# Patient Record
Sex: Female | Born: 1995 | Race: Black or African American | Hispanic: No | Marital: Single | Smoking: Current every day smoker
Health system: Southern US, Community
[De-identification: ages and names within clinical notes are randomized; demographics above are authoritative.]

## PROBLEM LIST (undated history)

## (undated) DIAGNOSIS — F329 Major depressive disorder, single episode, unspecified: Secondary | ICD-10-CM

## (undated) DIAGNOSIS — R062 Wheezing: Secondary | ICD-10-CM

## (undated) DIAGNOSIS — F419 Anxiety disorder, unspecified: Secondary | ICD-10-CM

## (undated) DIAGNOSIS — E785 Hyperlipidemia, unspecified: Secondary | ICD-10-CM

## (undated) DIAGNOSIS — D649 Anemia, unspecified: Secondary | ICD-10-CM

## (undated) DIAGNOSIS — R7303 Prediabetes: Secondary | ICD-10-CM

## (undated) DIAGNOSIS — J45909 Unspecified asthma, uncomplicated: Secondary | ICD-10-CM

## (undated) DIAGNOSIS — T7840XA Allergy, unspecified, initial encounter: Secondary | ICD-10-CM

## (undated) DIAGNOSIS — F32A Depression, unspecified: Secondary | ICD-10-CM

## (undated) HISTORY — PX: TONSILLECTOMY: SUR1361

## (undated) HISTORY — PX: TIBIA FRACTURE SURGERY: SHX806

---
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
## 2017-12-25 IMAGING — CR DG ABDOMEN 2V
1 series · 3 of 3 positions shown · non-contrast
Comparison: None.

CLINICAL DATA: One month history of abdominal pain.

EXAM:
ABDOMEN - 2 VIEW

[Series 1: dg abd 2 views · 0.14mm/px · 3 of 3 slices shown]
[im 1/3]
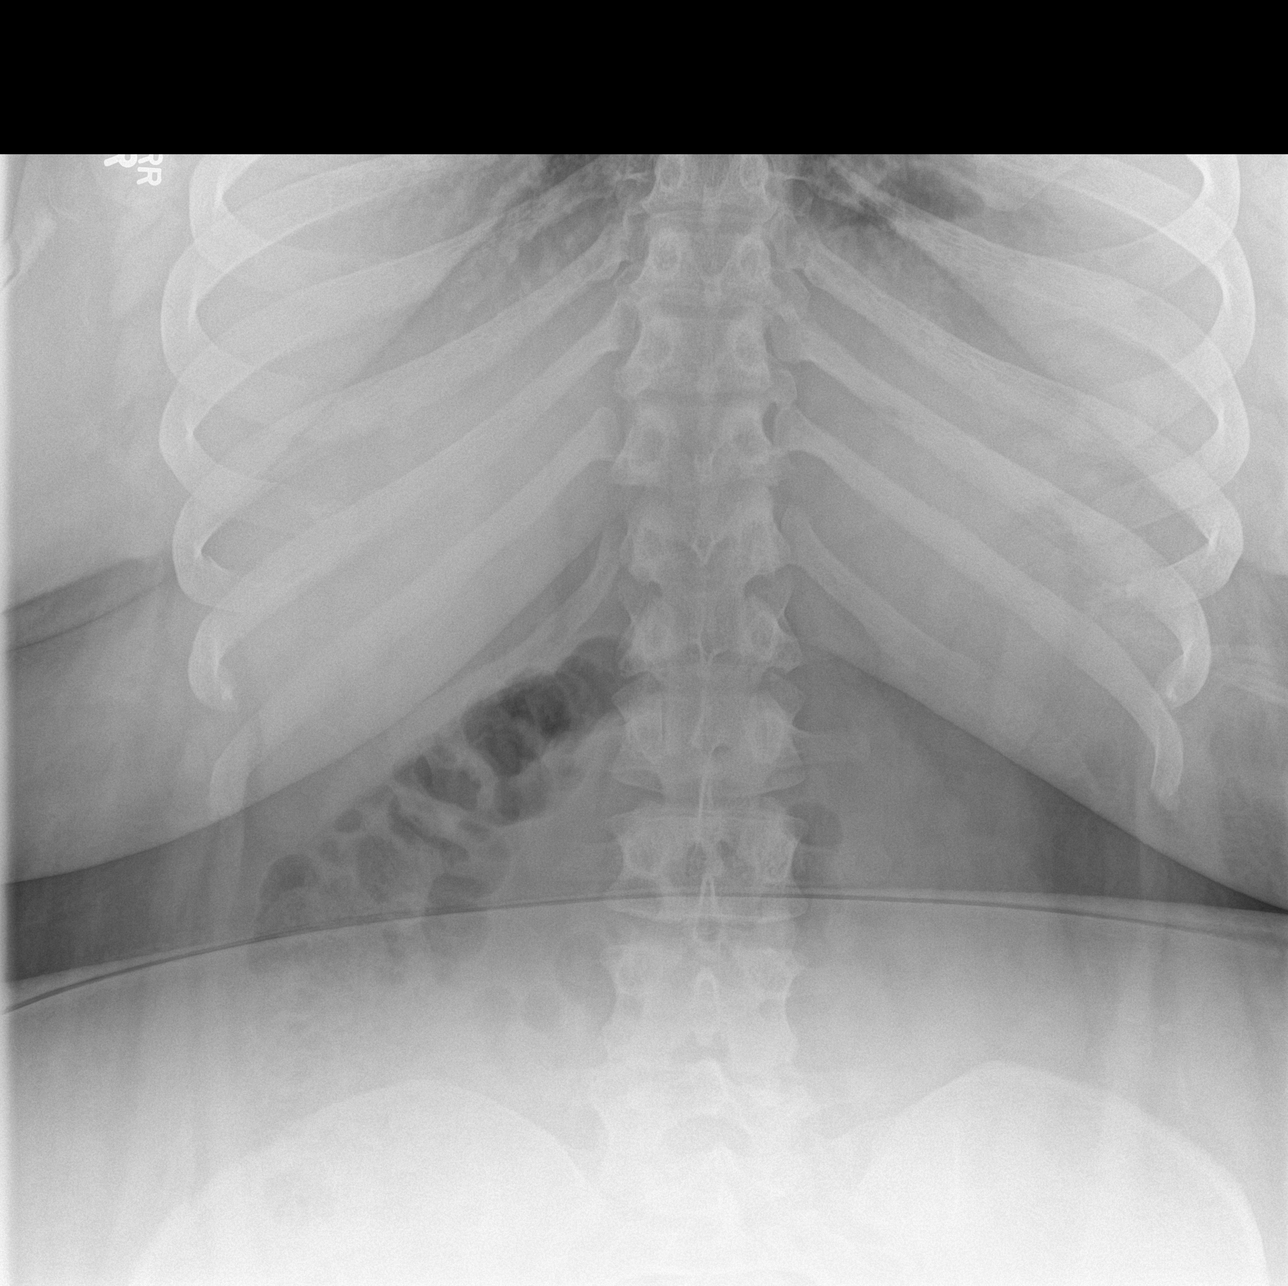
[im 2/3]
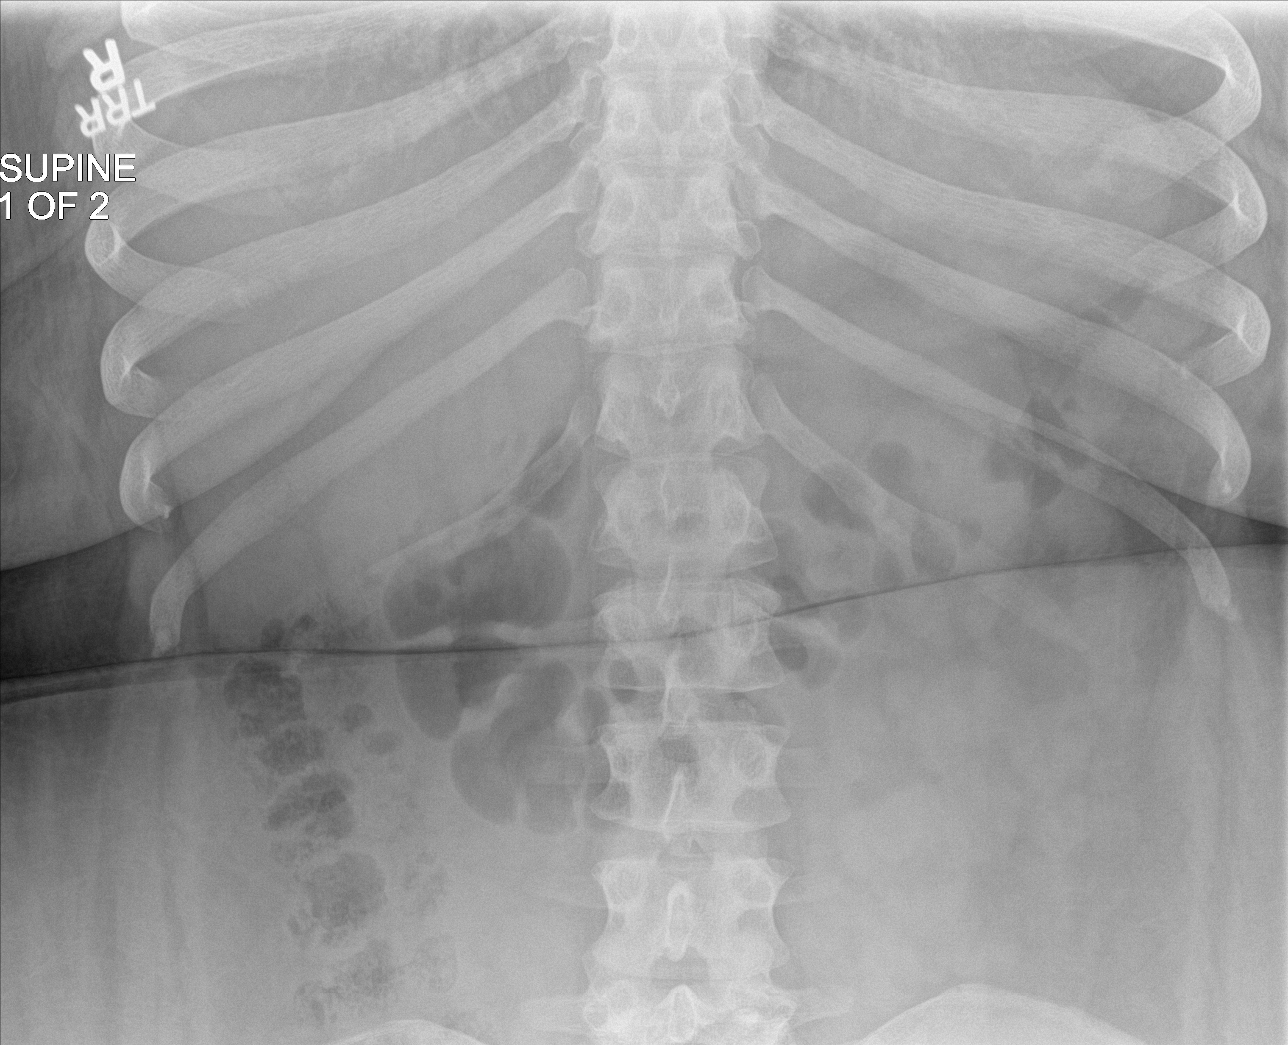
[im 3/3]
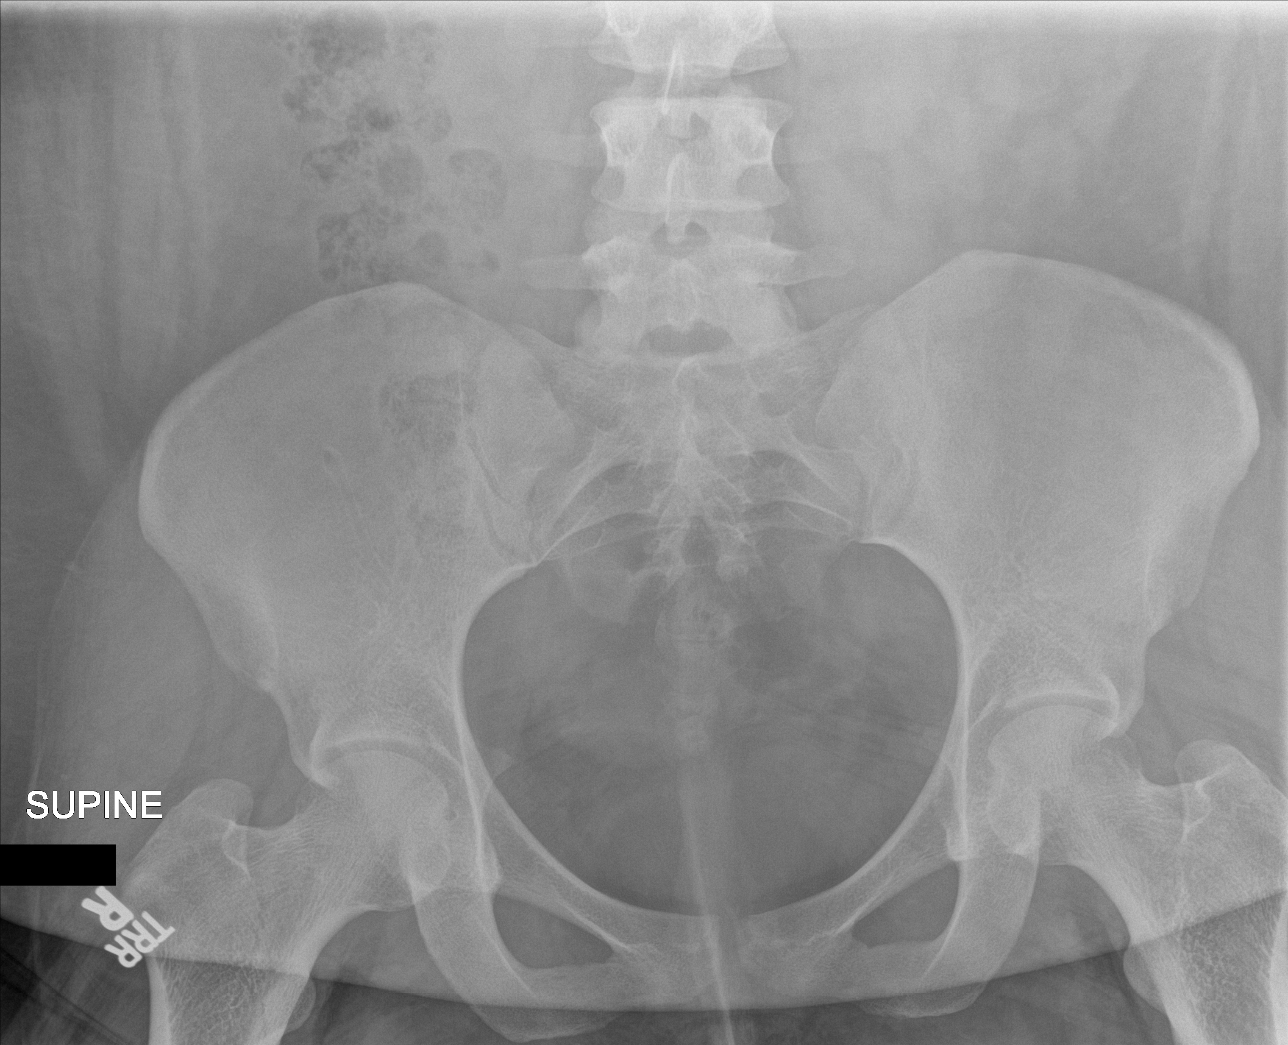

[3 of 3 positions shown; findings below may reference images not displayed]

FINDINGS: The abdominal bowel gas pattern is unremarkable. No findings for
obstruction an or perforation. The soft tissue shadows are
maintained. No worrisome calcifications. The bony structures are
unremarkable.
IMPRESSION: Unremarkable abdominal radiographs.

## 2018-04-19 ENCOUNTER — Emergency Department
Admission: EM | Admit: 2018-04-19 | Discharge: 2018-04-19 | Disposition: A | Discharge status: Home / Self Care | Payer: Self-pay | Attending: Emergency Medicine | Admitting: Emergency Medicine

## 2018-04-19 ENCOUNTER — Other Ambulatory Visit: Payer: Self-pay

## 2018-04-19 DIAGNOSIS — Z79899 Other long term (current) drug therapy: Secondary | ICD-10-CM | POA: Insufficient documentation

## 2018-04-19 DIAGNOSIS — J0100 Acute maxillary sinusitis, unspecified: Secondary | ICD-10-CM | POA: Insufficient documentation

## 2018-04-19 MED ORDER — LORATADINE 10 MG PO TABS: 10.0000 mg | ORAL_TABLET | Freq: Every day | ORAL | 0 refills | Status: DC

## 2018-04-19 MED ORDER — FLUTICASONE PROPIONATE 50 MCG/ACT NA SUSP: 2.0000 | Freq: Every day | NASAL | 0 refills | Status: DC

## 2018-04-19 MED ORDER — DOXYCYCLINE HYCLATE 50 MG PO CAPS: 100.0000 mg | ORAL_CAPSULE | Freq: Two times a day (BID) | ORAL | 0 refills | Status: AC

## 2018-04-19 NOTE — ED Notes (Signed)
Pt with face, ear pain x 3 days. Clear breath sounds. Decreased hearing. Generalized aches.

## 2018-04-19 NOTE — ED Provider Notes (Signed)
[facility] Emergency Department Provider Note  ____________________________________________  Time seen: Approximately 1:44 PM  I have reviewed the triage vital signs and the nursing notes.   HISTORY  Chief Complaint URI    HPI Kellie Schmidt is a 22 y.o. female that presents to emergency department for evaluation of left ear pain, nasal congestion for 4 days.  She is having difficulty hearing out of left ear.  Mother states that cheeks felt swollen this morning.  Patient has a sinus infection every year.  She has seasonal allergies.  She smokes a couple of "blacks " a day.  No fever, chills, shortness of breath, chest pain, nausea, vomiting, abdominal pain.   Past Medical History:  Diagnosis Date  . Anemia   . Borderline diabetic   . Depression   . Hyperlipemia   . Morbid obesity (HCC)   . Wheezing     There are no active problems to display for this patient.   Past Surgical History:  Procedure Laterality Date  . TIBIA FRACTURE SURGERY    . TONSILLECTOMY      Prior to Admission medications   Medication Sig Start Date End Date Taking? Authorizing Provider  Albuterol Sulfate 108 (90 Base) MCG/ACT AEPB Inhale into the lungs.    [provider]  citalopram (CELEXA) 10 MG tablet Take 10 mg by mouth daily.    [provider]  cyclobenzaprine (FLEXERIL) 10 MG tablet Take 1 tablet (10 mg total) by mouth 3 (three) times daily as needed for muscle spasms. 07/06/15   Cuthriell, Delorise RoyalsJonathan D, PA-C  doxycycline (VIBRAMYCIN) 50 MG capsule Take 2 capsules (100 mg total) by mouth 2 (two) times daily for 10 days. 04/19/18 04/29/18  Enid DerryWagner, Teighlor Korson, PA-C  ferrous sulfate 325 (65 FE) MG tablet Take 325 mg by mouth daily with breakfast.    [provider]  fluticasone (FLONASE) 50 MCG/ACT nasal spray Place 2 sprays into both nostrils daily. 04/19/18 04/19/19  Enid DerryWagner, Shawna Kiener, PA-C  loratadine (CLARITIN) 10 MG tablet Take 1 tablet (10 mg total) by  mouth daily. 04/19/18 04/19/19  Enid DerryWagner, Yaneli Keithley, PA-C  meloxicam (MOBIC) 15 MG tablet Take 1 tablet (15 mg total) by mouth daily. 07/06/15   Cuthriell, Delorise RoyalsJonathan D, PA-C  metFORMIN (GLUCOPHAGE) 500 MG tablet Take by mouth 2 (two) times daily with a meal.    [provider]  norgestimate-ethinyl estradiol (ORTHO-CYCLEN,SPRINTEC,PREVIFEM) 0.25-35 MG-MCG tablet Take 1 tablet by mouth daily.    [provider]  pantoprazole (PROTONIX) 40 MG tablet Take 1 tablet (40 mg total) by mouth daily. 09/23/16 09/23/17  Emily FilbertWilliams, Jonathan E, MD    Allergies Penicillins  No family history on file.  Social History Social History   Tobacco Use  . Smoking status: Never Smoker  . Smokeless tobacco: Never Used  Substance Use Topics  . Alcohol use: No  . Drug use: Not on file     Review of Systems  Constitutional: No fever/chills ENT: Positive for nasal congestion.  Cardiovascular: No chest pain. Respiratory: No cough. No SOB. Gastrointestinal: No abdominal pain.  No nausea, no vomiting.  Musculoskeletal: Negative for musculoskeletal pain. Skin: Negative for rash, abrasions, lacerations, ecchymosis. Neurological: Negative for headaches, numbness or tingling   ____________________________________________   PHYSICAL EXAM:  VITAL SIGNS: ED Triage Vitals  Enc Vitals Group     BP 04/19/18 1255 (!) 138/94     Pulse Rate 04/19/18 1255 83     Resp 04/19/18 1255 16     Temp 04/19/18 1255  98.3 F (36.8 C)     Temp Source 04/19/18 1255 Oral     SpO2 04/19/18 1255 98 %     Weight 04/19/18 1254 (!) 390 lb (176.9 kg)     Height 04/19/18 1254 5\' 8"  (1.727 m)     Head Circumference --      Peak Flow --      Pain Score 04/19/18 1256 5     Pain Loc --      Pain Edu? --      Excl. in [place]? --      Constitutional: Alert and oriented. Well appearing and in no acute distress. Eyes: Conjunctivae are normal. PERRL. EOMI. Head: Atraumatic. ENT: Maxillary sinus tenderness.      Ears:  Tympanic membranes are pearly. No drainage. No tenderness to palpation of pinna.       Nose: Mild congestion/rhinnorhea.      Mouth/Throat: Mucous membranes are moist. Oropharynx non erythematous. Tonsils not enlarged. Uvula midline.  Neck: No stridor.   Cardiovascular: Normal rate, regular rhythm.  Good peripheral circulation. Respiratory: Normal respiratory effort without tachypnea or retractions. Lungs CTAB. Good air entry to the bases with no decreased or absent breath sounds. Gastrointestinal: Bowel sounds 4 quadrants. Soft and nontender to palpation. No guarding or rigidity. No palpable masses. No distention.  Musculoskeletal: Full range of motion to all extremities. No gross deformities appreciated. Neurologic:  Normal speech and language. No gross focal neurologic deficits are appreciated.  Skin:  Skin is warm, dry and intact. No rash noted. Psychiatric: Mood and affect are normal. Speech and behavior are normal. Patient exhibits appropriate insight and judgement.   ____________________________________________   LABS (all labs ordered are listed, but only abnormal results are displayed)  Labs Reviewed - No data to display ____________________________________________  EKG   ____________________________________________  RADIOLOGY   No results found.  ____________________________________________    PROCEDURES  Procedure(s) performed:    Procedures    Medications - No data to display   ____________________________________________   INITIAL IMPRESSION / ASSESSMENT AND PLAN / ED COURSE  Pertinent labs & imaging results that were available during my care of the patient were reviewed by me and considered in my medical decision making (see chart for details).  Review of the [place] CSRS was performed in accordance of the [facility] prior to dispensing any controlled drugs.     Patient's diagnosis is consistent with sinusitis. Vital signs and exam are reassuring.  Patient will be discharged home with prescriptions for doxycycline, flonase, claritin. Patient is to follow up with PCP as directed. Patient is given ED precautions to return to the ED for any worsening or new symptoms.     ____________________________________________  FINAL CLINICAL IMPRESSION(S) / ED DIAGNOSES  Final diagnoses:  Acute non-recurrent maxillary sinusitis      NEW MEDICATIONS STARTED DURING THIS VISIT:  ED Discharge Orders        Ordered    doxycycline (VIBRAMYCIN) 50 MG capsule  2 times daily     04/19/18 1441    fluticasone (FLONASE) 50 MCG/ACT nasal spray  Daily     04/19/18 1441    loratadine (CLARITIN) 10 MG tablet  Daily     04/19/18 1441          This chart was dictated using voice recognition software/Dragon. Despite best efforts to proofread, errors can occur which can change the meaning. Any change was purely unintentional.    Enid Derry, PA-C 04/19/18 1543    Jene Every, MD 04/20/18  0718  

## 2018-04-19 NOTE — ED Triage Notes (Signed)
Pt c/o sinus congestion with BL ear pain for the past 3 days.

## 2019-02-19 ENCOUNTER — Ambulatory Visit: Payer: Self-pay | Admitting: *Deleted

## 2019-02-19 NOTE — Telephone Encounter (Signed)
Pt called with having chest pain in the middle of her breasts. The pain comes and goes and lasted for about an hour last evening. She has dizziness and some nausea thru out the day. She works at [employer]. She has not measured her temperature. Does not feel feverish. Feels tired.  Has some shortness of breath. Advised that if she starts having respiratory distress, 911 needs to be called. She voiced understanding.  Advised that she go to the hospital now to be seen, since she does not have a pcp. She stated her mom will be taking her to [facility], to be tested.  Advised also that she needs to be quarantine. Pt voiced understanding.  Reason for Disposition . Chest pain  Answer Assessment - Initial Assessment Questions 1. LOCATION: "Where does it hurt?"       In between her breasts 2. RADIATION: "Does the pain go anywhere else?" (e.g., into neck, jaw, arms, back)     no 3. ONSET: "When did the chest pain begin?" (Minutes, hours or days)      yesterday 4. PATTERN "Does the pain come and go, or has it been constant since it started?"  "Does it get worse with exertion?"      Comes and goes 5. DURATION: "How long does it last" (e.g., seconds, minutes, hours)     Varies throughout the day (yesterday it lasted for about an hour) 6. SEVERITY: "How bad is the pain?"  (e.g., Scale 1-10; mild, moderate, or severe)    - MILD (1-3): doesn't interfere with normal activities     - MODERATE (4-7): interferes with normal activities or awakens from sleep    - SEVERE (8-10): excruciating pain, unable to do any normal activities       Pain # 5 this morning 7. CARDIAC RISK FACTORS: "Do you have any history of heart problems or risk factors for heart disease?" (e.g., prior heart attack, angina; high blood pressure, diabetes, being overweight, high cholesterol, smoking, or strong family history of heart disease)     Family history, overweight, smoker, borderline diabetic 8. PULMONARY RISK FACTORS: "Do  you have any history of lung disease?"  (e.g., blood clots in lung, asthma, emphysema, birth control pills)     Hx of asthma, hx of birth control pills 9. CAUSE: "What do you think is causing the chest pain?"     Not sure 10. OTHER SYMPTOMS: "Do you have any other symptoms?" (e.g., dizziness, nausea, vomiting, sweating, fever, difficulty breathing, cough)       Congestion, body hurts, some shortness of breath, nausea and dizziness 11. PREGNANCY: "Is there any chance you are pregnant?" "When was your last menstrual period?"       Not pregnant. LMP last month week of the 22nd  Answer Assessment - Initial Assessment Questions 1. COVID-19 DIAGNOSIS: "Who made your Coronavirus (COVID-19) diagnosis?" "Was it confirmed by a positive lab test?" If not diagnosed by a HCP, ask "Are there lots of cases (community spread) where you live?" (See public health department website, if unsure)   * MAJOR community spread: high number of cases; numbers of cases are increasing; many people hospitalized.   * MINOR community spread: low number of cases; not increasing; few or no people hospitalized     community 2. ONSET: "When did the COVID-19 symptoms start?"      yesterday 3. WORST SYMPTOM: "What is your worst symptom?" (e.g., cough, fever, shortness of breath, muscle aches)     Chest pain  4. COUGH: "Do you have a cough?" If so, ask: "How bad is the cough?"       no 5. FEVER: "Do you have a fever?" If so, ask: "What is your temperature, how was it measured, and when did it start?"     Not measeured 6. RESPIRATORY STATUS: "Describe your breathing?" (e.g., shortness of breath, wheezing, unable to speak)      Some shortness of breath 7. BETTER-SAME-WORSE: "Are you getting better, staying the same or getting worse compared to yesterday?"  If getting worse, ask, "In what way?"     worst 8. HIGH RISK DISEASE: "Do you have any chronic medical problems?" (e.g., asthma, heart or lung disease, weak immune system,  etc.)     Hx of asthma 9. PREGNANCY: "Is there any chance you are pregnant?" "When was your last menstrual period?"     Not pregnant. LMP April 22 nd 10. OTHER SYMPTOMS: "Do you have any other symptoms?"  (e.g., runny nose, headache, sore throat, loss of smell)       Dizziness, nausea  Protocols used: CORONAVIRUS (COVID-19) DIAGNOSED OR SUSPECTED-A-AH, CHEST PAIN-A-AH

## 2019-05-05 ENCOUNTER — Encounter: Payer: Self-pay | Admitting: Emergency Medicine

## 2019-05-05 ENCOUNTER — Other Ambulatory Visit: Payer: Self-pay

## 2019-05-05 ENCOUNTER — Emergency Department
Admission: EM | Admit: 2019-05-05 | Discharge: 2019-05-05 | Disposition: A | Discharge status: Home / Self Care | Payer: HRSA Program | Attending: Emergency Medicine | Admitting: Emergency Medicine

## 2019-05-05 DIAGNOSIS — Z20828 Contact with and (suspected) exposure to other viral communicable diseases: Secondary | ICD-10-CM | POA: Diagnosis not present

## 2019-05-05 DIAGNOSIS — R51 Headache: Secondary | ICD-10-CM | POA: Diagnosis not present

## 2019-05-05 DIAGNOSIS — M7918 Myalgia, other site: Secondary | ICD-10-CM | POA: Diagnosis not present

## 2019-05-05 DIAGNOSIS — R509 Fever, unspecified: Secondary | ICD-10-CM | POA: Diagnosis present

## 2019-05-05 DIAGNOSIS — B349 Viral infection, unspecified: Secondary | ICD-10-CM

## 2019-05-05 MED ORDER — PSEUDOEPH-BROMPHEN-DM 30-2-10 MG/5ML PO SYRP: 5.0000 mL | ORAL_SOLUTION | Freq: Four times a day (QID) | ORAL | 0 refills | Status: DC | PRN

## 2019-05-05 MED ORDER — IBUPROFEN 600 MG PO TABS: 600.0000 mg | ORAL_TABLET | Freq: Three times a day (TID) | ORAL | 0 refills | Status: DC | PRN

## 2019-05-05 NOTE — ED Provider Notes (Signed)
[facility] Emergency Department Provider Note   ____________________________________________   First MD Initiated Contact with Patient 05/05/19 1348     (approximate)  I have reviewed the triage vital signs and the nursing notes.   HISTORY  Chief Complaint Fever and Generalized Body Aches    HPI Kellie Schmidt is a 23 y.o. female patient presents with subjective fever, cough, body ache and headache for 4 days.  Patient also complain of sore throat.  Patient denies nausea, vomiting, diarrhea.  Patient requesting COVID test before returning back to work.  Patient denies positive contact with COVID individuals.  Patient concerned because she works at [employer] which she believes increase her exposure to the virus.      Past Medical History:  Diagnosis Date  . Anemia   . Borderline diabetic   . Depression   . Hyperlipemia   . Morbid obesity (HCC)   . Wheezing     There are no active problems to display for this patient.   Past Surgical History:  Procedure Laterality Date  . TIBIA FRACTURE SURGERY    . TONSILLECTOMY      Prior to Admission medications   Medication Sig Start Date End Date Taking? Authorizing Provider  Albuterol Sulfate 108 (90 Base) MCG/ACT AEPB Inhale into the lungs.    [provider]  brompheniramine-pseudoephedrine-DM 30-2-10 MG/5ML syrup Take 5 mLs by mouth 4 (four) times daily as needed. 05/05/19   Joni ReiningSmith, Ronald K, PA-C  citalopram (CELEXA) 10 MG tablet Take 10 mg by mouth daily.    [provider]  cyclobenzaprine (FLEXERIL) 10 MG tablet Take 1 tablet (10 mg total) by mouth 3 (three) times daily as needed for muscle spasms. 07/06/15   Cuthriell, Delorise RoyalsJonathan D, PA-C  ferrous sulfate 325 (65 FE) MG tablet Take 325 mg by mouth daily with breakfast.    [provider]  fluticasone (FLONASE) 50 MCG/ACT nasal spray Place 2 sprays into both nostrils daily. 04/19/18 04/19/19  Enid DerryWagner, Ashley, PA-C  ibuprofen  (ADVIL) 600 MG tablet Take 1 tablet (600 mg total) by mouth every 8 (eight) hours as needed. 05/05/19   Joni ReiningSmith, Ronald K, PA-C  loratadine (CLARITIN) 10 MG tablet Take 1 tablet (10 mg total) by mouth daily. 04/19/18 04/19/19  Enid DerryWagner, Ashley, PA-C  meloxicam (MOBIC) 15 MG tablet Take 1 tablet (15 mg total) by mouth daily. 07/06/15   Cuthriell, Delorise RoyalsJonathan D, PA-C  metFORMIN (GLUCOPHAGE) 500 MG tablet Take by mouth 2 (two) times daily with a meal.    [provider]  norgestimate-ethinyl estradiol (ORTHO-CYCLEN,SPRINTEC,PREVIFEM) 0.25-35 MG-MCG tablet Take 1 tablet by mouth daily.    [provider]  pantoprazole (PROTONIX) 40 MG tablet Take 1 tablet (40 mg total) by mouth daily. 09/23/16 09/23/17  Emily FilbertWilliams, Jonathan E, MD    Allergies Penicillins  History reviewed. No pertinent family history.  Social History Social History   Tobacco Use  . Smoking status: Never Smoker  . Smokeless tobacco: Never Used  Substance Use Topics  . Alcohol use: No  . Drug use: Not on file    Review of Systems  Constitutional: No fever/chills.  Fatigue and body aches. Eyes: No visual changes. ENT: Sore throat. Cardiovascular: Denies chest pain. Respiratory: Denies shortness of breath.  Nonproductive cough. Gastrointestinal: No abdominal pain.  No nausea, no vomiting.  No diarrhea.  No constipation. Genitourinary: Negative for dysuria. Musculoskeletal: Negative for back pain. Skin: Negative for rash. Neurological: Negative for headaches, focal weakness or numbness. Psychiatric:  Depression. Endocrine:  Borderline diabetic. Allergic/Immunilogical: Penicillin. ____________________________________________   PHYSICAL EXAM:  VITAL SIGNS: ED Triage Vitals [05/05/19 1340]  Enc Vitals Group     BP 139/90     Pulse Rate 76     Resp 18     Temp 98.6 F (37 C)     Temp Source Oral     SpO2 99 %     Weight (!) 394 lb (178.7 kg)     Height 5\' 7"  (1.702 m)     Head Circumference      Peak  Flow      Pain Score 6     Pain Loc      Pain Edu?      Excl. in [place]?     Constitutional: Alert and oriented. Well appearing and in no acute distress.  Morbid obesity. Eyes: Conjunctivae are normal. PERRL. EOMI. Head: Atraumatic. Nose: No congestion/rhinnorhea. Mouth/Throat: Mucous membranes are moist.  Oropharynx non-erythematous. Neck: No stridor.   Hematological/Lymphatic/Immunilogical: No cervical lymphadenopathy. Cardiovascular: Normal rate, regular rhythm. Grossly normal heart sounds.  Good peripheral circulation. Respiratory: Normal respiratory effort.  No retractions. Lungs CTAB. Skin:  Skin is warm, dry and intact. No rash noted. Psychiatric: Mood and affect are normal. Speech and behavior are normal.  ____________________________________________   LABS (all labs ordered are listed, but only abnormal results are displayed)  Labs Reviewed  NOVEL CORONAVIRUS, NAA (HOSPITAL ORDER, SEND-OUT TO REF LAB)   ____________________________________________  EKG   ____________________________________________  [place]  ED MD interpretation:    Official radiology report(s): No results found.  ____________________________________________   PROCEDURES  Procedure(s) performed (including Critical Care):  Procedures   ____________________________________________   INITIAL IMPRESSION / ASSESSMENT AND PLAN / ED COURSE  As part of my medical decision making, I reviewed the following data within the Benjamin was evaluated in Emergency Department on 05/05/2019 for the symptoms described in the history of present illness. She was evaluated in the context of the global COVID-19 pandemic, which necessitated consideration that the patient might be at risk for infection with the SARS-CoV-2 virus that causes COVID-19. Institutional protocols and algorithms that pertain to the evaluation of patients at risk for COVID-19 are in a  state of rapid change based on information released by regulatory bodies including the CDC and federal and state organizations. These policies and algorithms were followed during the patient's care in the ED.  Patient presents with subjective fever, nonproductive cough, body aches and headache.  Physical exam is grossly unremarkable.  Complains consistent with viral illness.  Patient will have a COVID test and advise quarantine that she had results.      ____________________________________________   FINAL CLINICAL IMPRESSION(S) / ED DIAGNOSES  Final diagnoses:  Viral illness     ED Discharge Orders         Ordered    brompheniramine-pseudoephedrine-DM 30-2-10 MG/5ML syrup  4 times daily PRN     05/05/19 1405    ibuprofen (ADVIL) 600 MG tablet  Every 8 hours PRN     05/05/19 1405           Note:  This document was prepared using Dragon voice recognition software and may include unintentional dictation errors.    Sable Feil, PA-C 05/05/19 1406    Carrie Mew, MD 05/05/19 [phone]

## 2019-05-05 NOTE — ED Triage Notes (Signed)
Pt c/o subjective fevers, cough (has improved), body aches, and headache since last Friday.  Has not taken any medication today. Ambulatory. VSS. Afebrile here. Pt reports her mom told her to say she worked at [employer].  Pt also requesting test results to return to work; explained if she is tested for covid results will take couple days.

## 2019-05-05 NOTE — Discharge Instructions (Signed)
Advised self quarantine until results are covert estimate available.

## 2019-05-05 NOTE — ED Notes (Signed)
Pt c/o body aches, chills, and fever at home. Unsure of home temperature because of no thermometer. Took iron and BC powder OTC at home for symptoms

## 2019-05-06 LAB — NOVEL CORONAVIRUS, NAA (HOSP ORDER, SEND-OUT TO REF LAB; TAT 18-24 HRS): SARS-CoV-2, NAA: NOT DETECTED

## 2022-01-02 ENCOUNTER — Encounter: Payer: Self-pay | Admitting: Emergency Medicine

## 2022-01-02 ENCOUNTER — Other Ambulatory Visit: Payer: Self-pay

## 2022-01-02 ENCOUNTER — Emergency Department
Admission: EM | Admit: 2022-01-02 | Discharge: 2022-01-02 | Disposition: A | Discharge status: Home / Self Care | Payer: Commercial Managed Care - PPO | Attending: Emergency Medicine | Admitting: Emergency Medicine

## 2022-01-02 DIAGNOSIS — B349 Viral infection, unspecified: Secondary | ICD-10-CM | POA: Insufficient documentation

## 2022-01-02 DIAGNOSIS — Z20822 Contact with and (suspected) exposure to covid-19: Secondary | ICD-10-CM | POA: Diagnosis not present

## 2022-01-02 DIAGNOSIS — R55 Syncope and collapse: Secondary | ICD-10-CM | POA: Diagnosis not present

## 2022-01-02 DIAGNOSIS — R531 Weakness: Secondary | ICD-10-CM | POA: Diagnosis present

## 2022-01-02 LAB — CK: Total CK: 152 U/L (ref 38–234)

## 2022-01-02 LAB — BASIC METABOLIC PANEL
Anion gap: 7 (ref 5–15)
BUN: 11 mg/dL (ref 6–20)
CO2: 24 mmol/L (ref 22–32)
Calcium: 9.2 mg/dL (ref 8.9–10.3)
Chloride: 103 mmol/L (ref 98–111)
Creatinine, Ser: 0.57 mg/dL (ref 0.44–1.00)
GFR, Estimated: >60 mL/min (ref >60)
Glucose, Bld: 102 mg/dL — ABNORMAL HIGH (ref 70–99)
Potassium: 3.7 mmol/L (ref 3.5–5.1)
Sodium: 134 mmol/L — ABNORMAL LOW (ref 135–145)

## 2022-01-02 LAB — RESP PANEL BY RT-PCR (FLU A&B, COVID) ARPGX2
Influenza A by PCR: NEGATIVE
Influenza B by PCR: NEGATIVE
SARS Coronavirus 2 by RT PCR: NEGATIVE

## 2022-01-02 LAB — HEPATIC FUNCTION PANEL
ALT: 18 U/L (ref 0–44)
AST: 22 U/L (ref 15–41)
Albumin: 3.8 g/dL (ref 3.5–5.0)
Alkaline Phosphatase: 57 U/L (ref 38–126)
Bilirubin, Direct: <0.1 mg/dL (ref 0.0–0.2)
Total Bilirubin: 0.4 mg/dL (ref 0.3–1.2)
Total Protein: 8.3 g/dL — ABNORMAL HIGH (ref 6.5–8.1)

## 2022-01-02 LAB — URINALYSIS, ROUTINE W REFLEX MICROSCOPIC
Bacteria, UA: NONE SEEN
Bilirubin Urine: NEGATIVE
Glucose, UA: NEGATIVE mg/dL
Ketones, ur: NEGATIVE mg/dL
Leukocytes,Ua: NEGATIVE
Nitrite: NEGATIVE
Protein, ur: 30 mg/dL — AB
RBC / HPF: >50 RBC/hpf — ABNORMAL HIGH (ref 0–5)
Specific Gravity, Urine: 1.018 (ref 1.005–1.030)
Squamous Epithelial / HPF: NONE SEEN (ref 0–5)
pH: 6 (ref 5.0–8.0)

## 2022-01-02 LAB — CBC
HCT: 38.6 % (ref 36.0–46.0)
Hemoglobin: 12.4 g/dL (ref 12.0–15.0)
MCH: 27 pg (ref 26.0–34.0)
MCHC: 32.1 g/dL (ref 30.0–36.0)
MCV: 83.9 fL (ref 80.0–100.0)
Platelets: 304 10*3/uL (ref 150–400)
RBC: 4.6 MIL/uL (ref 3.87–5.11)
RDW: 13.3 % (ref 11.5–15.5)
WBC: 6.5 10*3/uL (ref 4.0–10.5)
nRBC: 0 % (ref 0.0–0.2)

## 2022-01-02 LAB — CBG MONITORING, ED: Glucose-Capillary: 104 mg/dL — ABNORMAL HIGH (ref 70–99)

## 2022-01-02 LAB — T4, FREE: Free T4: 1.02 ng/dL (ref 0.61–1.12)

## 2022-01-02 LAB — TSH: TSH: 1.398 u[IU]/mL (ref 0.350–4.500)

## 2022-01-02 LAB — TROPONIN I (HIGH SENSITIVITY): Troponin I (High Sensitivity): <2 ng/L (ref <18)

## 2022-01-02 LAB — PREGNANCY, URINE: Preg Test, Ur: NEGATIVE

## 2022-01-02 LAB — LIPASE, BLOOD: Lipase: 24 U/L (ref 11–51)

## 2022-01-02 LAB — MAGNESIUM: Magnesium: 2 mg/dL (ref 1.7–2.4)

## 2022-01-02 MED ORDER — ONDANSETRON HCL 4 MG/2ML IJ SOLN
4.0000 mg | Freq: Once | INTRAMUSCULAR | Status: DC
  Filled 2022-01-02: qty 2

## 2022-01-02 MED ORDER — ONDANSETRON 4 MG PO TBDP: 4.0000 mg | ORAL_TABLET | Freq: Three times a day (TID) | ORAL | 0 refills | Status: DC | PRN

## 2022-01-02 MED ORDER — DICYCLOMINE HCL 20 MG PO TABS: 20.0000 mg | ORAL_TABLET | Freq: Four times a day (QID) | ORAL | 0 refills | Status: DC | PRN

## 2022-01-02 MED ORDER — PANTOPRAZOLE SODIUM 40 MG IV SOLR
40.0000 mg | Freq: Once | INTRAVENOUS | Status: AC
  Administered 2022-01-02: 40 mg via INTRAVENOUS
  Filled 2022-01-02: qty 10

## 2022-01-02 MED ORDER — LACTATED RINGERS IV BOLUS
1000.0000 mL | Freq: Once | INTRAVENOUS | Status: AC
  Administered 2022-01-02: 1000 mL via INTRAVENOUS

## 2022-01-02 MED ORDER — FAMOTIDINE 20 MG PO TABS: 20.0000 mg | ORAL_TABLET | Freq: Two times a day (BID) | ORAL | 0 refills | Status: DC

## 2022-01-02 MED ORDER — KETOROLAC TROMETHAMINE 15 MG/ML IJ SOLN
15.0000 mg | Freq: Once | INTRAMUSCULAR | Status: DC
  Filled 2022-01-02: qty 1

## 2022-01-02 NOTE — ED Notes (Addendum)
Mother to pt is upset pt is in the hallway, this RN explained to them that it is not ideal but we wanted to make sure she was seen and that is the bed we had available at the time. Biggest complaint was that "it was too cold", multiple blankets were given to pt. This RN spoke to MD and dispo is going to be to discharge home. Mom states "I want to sign her out" MD aware.   ?

## 2022-01-02 NOTE — ED Notes (Signed)
Pt's mother upset that she is in the hallway. This RN explained that this was the only spot available when pt arrived and as things are opening up/pts are moving, we can work on getting her into a room.  ?

## 2022-01-02 NOTE — ED Provider Notes (Signed)
? ?[facility] ?Provider Note ? ? ? Event Date/Time  ? First MD Initiated Contact with Patient 01/02/22 1501   ?  (approximate) ? ? ?History  ? ?Weakness and Near Syncope ? ? ?HPI ? ?Kellie Schmidt is a 26 y.o. adult with a history of depression, morbid obesity who comes ED complaining of fatigue and body aches for the past 5 days, starting with an URI illness with cough and congestion.  The URI symptoms have now resolved but she has persistent fatigue and malaise, and today while at work she got lightheaded but did not pass out or fall.  No injuries. ? ?No focal pain currently.  Reports that she is currently on her menstrual cycle for the past 3 days.  No excessive bleeding.  No unusual vaginal discharge or dysuria.  No fever. ? ?Patient also reports a family history of hidradenitis suppurativa and notes that 2 weeks ago she had a swollen painful area on her left breast without drainage.  It has improved since then. ? ?Physical Exam  ? ?Triage Vital Signs: ?ED Triage Vitals  ?Enc Vitals Group  ?   BP 01/02/22 1358 (!) 139/109  ?   Pulse Rate 01/02/22 1358 86  ?   Resp 01/02/22 1358 18  ?   Temp 01/02/22 1358 98.6 ?F (37 ?C)  ?   Temp Source 01/02/22 1358 Oral  ?   SpO2 01/02/22 1358 99 %  ?   Weight 01/02/22 1340 (!) 393 lb 15.4 oz (178.7 kg)  ?   Height 01/02/22 1340 5\' 7"  (1.702 m)  ?   Head Circumference --   ?   Peak Flow --   ?   Pain Score 01/02/22 1340 5  ?   Pain Loc --   ?   Pain Edu? --   ?   Excl. in GC? --   ? ? ?Most recent vital signs: ?Vitals:  ? 01/02/22 1506 01/02/22 1830  ?BP: (!) 124/91 (!) 127/98  ?Pulse: 80 81  ?Resp: 16 19  ?Temp:    ?SpO2: 99% 99%  ? ? ? ?General: Awake, no distress.  ?CV:  Good peripheral perfusion.  Regular rate and rhythm ?Resp:  Normal effort.  Clear to auscultation bilaterally ?Abd:  No distention.  Soft nontender ?Other:  Breast exam performed with nurse 01/04/22 at bedside.  Area of concern on the left breast is slightly indurated without  fluctuance or significant tenderness.  There is no palpable mass.  There is overlying epidermal desquamation consistent with a healing focal infection.  Nipple is not retracted, no peau d'orange.  No nipple drainage.  Breasts appear symmetric when comparing to the unaffected right side. ? ?Extremity exam unremarkable, no inflammatory changes or asymmetric swelling, no calf tenderness.  Moist oral mucosa. ? ? ?ED Results / Procedures / Treatments  ? ?Labs ?(all labs ordered are listed, but only abnormal results are displayed) ?Labs Reviewed  ?BASIC METABOLIC PANEL - Abnormal; Notable for the following components:  ?    Result Value  ? Sodium 134 (*)   ? Glucose, Bld 102 (*)   ? All other components within normal limits  ?URINALYSIS, ROUTINE W REFLEX MICROSCOPIC - Abnormal; Notable for the following components:  ? Color, Urine AMBER (*)   ? APPearance CLOUDY (*)   ? Hgb urine dipstick LARGE (*)   ? Protein, ur 30 (*)   ? RBC / HPF >50 (*)   ? All other components within normal limits  ?  HEPATIC FUNCTION PANEL - Abnormal; Notable for the following components:  ? Total Protein 8.3 (*)   ? All other components within normal limits  ?CBG MONITORING, ED - Abnormal; Notable for the following components:  ? Glucose-Capillary 104 (*)   ? All other components within normal limits  ?RESP PANEL BY RT-PCR (FLU A&B, COVID) ARPGX2  ?CBC  ?PREGNANCY, URINE  ?MAGNESIUM  ?LIPASE, BLOOD  ?TSH  ?T4, FREE  ?CK  ?POC URINE PREG, ED  ?TROPONIN I (HIGH SENSITIVITY)  ?TROPONIN I (HIGH SENSITIVITY)  ? ? ? ?EKG ? ?Interpreted by me ?Normal sinus rhythm rate of 84.  Normal axis and intervals.  Normal QRS ST segments and T waves. ? ? ?RADIOLOGY ? ? ? ? ?PROCEDURES: ? ?Critical Care performed: No ? ?Procedures ? ? ?MEDICATIONS ORDERED IN ED: ?Medications  ?ketorolac (TORADOL) 15 MG/ML injection 15 mg (15 mg Intravenous Patient Refused/Not Given 01/02/22 1745)  ?ondansetron (ZOFRAN) injection 4 mg (4 mg Intravenous Patient Refused/Not Given 01/02/22  1744)  ?pantoprazole (PROTONIX) injection 40 mg (40 mg Intravenous Given 01/02/22 1636)  ?lactated ringers bolus 1,000 mL (1,000 mLs Intravenous Bolus 01/02/22 1635)  ? ? ? ?IMPRESSION / MDM / ASSESSMENT AND PLAN / ED COURSE  ?I reviewed the triage vital signs and the nursing notes. ?             ?               ? ?Differential diagnosis includes, but is not limited to, electrolyte abnormality, dehydration, anemia, viral syndrome ? ? ? ?Patient presents with malaise and fatigue for the past 5 days with symptoms highly suggestive of a viral illness.  Vital signs here are unremarkable.  She is nontoxic with a benign exam.  Lab panel is all unremarkable.  She is eating chips without any discomfort or vomiting.  She is given IV fluids Protonix Zofran and Toradol for supportive care and is not requiring hospitalization. ?  ? ? ?FINAL CLINICAL IMPRESSION(S) / ED DIAGNOSES  ? ?Final diagnoses:  ?Viral syndrome  ? ? ? ?Rx / DC Orders  ? ?ED Discharge Orders   ? ?      Ordered  ?  ondansetron (ZOFRAN-ODT) 4 MG disintegrating tablet  Every 8 hours PRN       ? 01/02/22 1837  ?  dicyclomine (BENTYL) 20 MG tablet  Every 6 hours PRN       ? 01/02/22 1837  ?  famotidine (PEPCID) 20 MG tablet  2 times daily       ? 01/02/22 1837  ? ?  ?  ? ?  ? ? ? ?Note:  This document was prepared using Dragon voice recognition software and may include unintentional dictation errors. ?  ?Sharman Mateo, MD ?01/02/22 1843 ? ?

## 2022-01-02 NOTE — ED Notes (Signed)
D/C and new RX's discussed with pt and mom verbalized understanding. NAD noted on D/C.  ?

## 2022-01-02 NOTE — ED Notes (Signed)
Pt presents to ED with c/o of near syncope episode today PTA. Pt states she has been feeling weak since this past Thursday/Friday. Pt states she has had some chest congestion, pt states intermittent chills but denies fevers. Pt is A&Ox4 at this time.  ? ?Pt does endorse some abscess/cyst concern with L breast. Pt states "it just went away but there is a bruise there". Pt denies having any interventions with this and "that it just went away".  ? ?Mom with pt.  ?

## 2022-01-02 NOTE — ED Triage Notes (Signed)
Pt comes into the ED via POV c/o weakness, near syncopal episode, and fatigue.  Pt states the exhaustion started last Friday, but she attempted to go to work today and had to call her mom due to near syncopal episode.  Pt assisted in lobby foyer to a wheelchair due to leaning on the wall.  Pt slow to response on answers.  ?

## 2022-01-14 ENCOUNTER — Encounter: Payer: Self-pay | Admitting: Nurse Practitioner

## 2022-01-14 ENCOUNTER — Ambulatory Visit: Payer: Commercial Managed Care - PPO | Admitting: Nurse Practitioner

## 2022-01-14 VITALS — BP 134/80 | HR 85 | Temp 98.9°F | Ht 67.0 in | Wt 389.0 lb

## 2022-01-14 DIAGNOSIS — Z113 Encounter for screening for infections with a predominantly sexual mode of transmission: Secondary | ICD-10-CM

## 2022-01-14 DIAGNOSIS — Z7689 Persons encountering health services in other specified circumstances: Secondary | ICD-10-CM

## 2022-01-14 DIAGNOSIS — R7303 Prediabetes: Secondary | ICD-10-CM | POA: Diagnosis not present

## 2022-01-14 DIAGNOSIS — Z01419 Encounter for gynecological examination (general) (routine) without abnormal findings: Secondary | ICD-10-CM

## 2022-01-14 DIAGNOSIS — Z720 Tobacco use: Secondary | ICD-10-CM

## 2022-01-14 DIAGNOSIS — Z1159 Encounter for screening for other viral diseases: Secondary | ICD-10-CM

## 2022-01-14 DIAGNOSIS — Z114 Encounter for screening for human immunodeficiency virus [HIV]: Secondary | ICD-10-CM

## 2022-01-14 DIAGNOSIS — F331 Major depressive disorder, recurrent, moderate: Secondary | ICD-10-CM | POA: Diagnosis not present

## 2022-01-14 DIAGNOSIS — Z8709 Personal history of other diseases of the respiratory system: Secondary | ICD-10-CM

## 2022-01-14 MED ORDER — ALBUTEROL SULFATE HFA 108 (90 BASE) MCG/ACT IN AERS: 2.0000 | INHALATION_SPRAY | Freq: Four times a day (QID) | RESPIRATORY_TRACT | 2 refills | Status: DC | PRN

## 2022-01-14 MED ORDER — BUPROPION HCL ER (XL) 150 MG PO TB24: 150.0000 mg | ORAL_TABLET | ORAL | 2 refills | Status: DC

## 2022-01-14 MED ORDER — NICOTINE 21 MG/24HR TD PT24: 21.0000 mg | MEDICATED_PATCH | TRANSDERMAL | 1 refills | Status: AC

## 2022-01-14 NOTE — Progress Notes (Signed)
?Industrial/product designer as a Education administrator for [employer], FNP.,have documented all relevant documentation on the behalf of Minette Brine, FNP,as directed by  Minette Brine, FNP while in the presence of Minette Brine, [place]. ? ?This visit occurred during the SARS-CoV-2 public health emergency.  Safety protocols were in place, including screening questions prior to the visit, additional usage of staff PPE, and extensive cleaning of exam room while observing appropriate contact time as indicated for disinfecting solutions. ? ?Subjective:  ?  ? Patient ID: Kellie Schmidt , adult    DOB: [birth year] , 26 y.o.   MRN: [id number] ? ? ?Chief Complaint  ?Patient presents with  ? Establish Care  ? ? ?HPI ? ?Patient presents today to establish care. She is unsure of who she has seen in the past. She found [place] with her insurance.  She works as a Microbiologist. Single. No children.  She does not have a GYN.  ?She has taken metformin since Middle and some of high school - tolerated well - made her less hungry and not eat as much. Asthma - albuterol inhaler - Rx was discontinued.  ? ? She would like to discuss issues with breast pain and discoloration. She would also like discuss mental health. ?She has been admitted to behavioral health due to suicidal thoughts the beginning of last year. Second time in April 2022 she attempted suicide - she was planning to make an exit hood but changed her mind. She was in [place]. She did live in [place] in 2017.  She lives in [place].  No current suicidal thoughts at this time.  ? ?Reports history of ADD.  She has seen psychiatry in the past Dr Stephanie Acre in [place].  ?  ? ?Past Medical History:  ?Diagnosis Date  ? Anemia   ? Borderline diabetic   ? Depression   ? Hyperlipemia   ? Morbid obesity ([place])   ? Wheezing   ?  ? ?Family History  ?Problem Relation Age of Onset  ? Diabetes Mother   ? Diabetes Father   ? Asthma Father   ? Hyperlipidemia Maternal Grandmother   ? Hypertension Maternal  Grandmother   ? Diabetes Maternal Grandfather   ? Cancer Maternal Grandfather   ? ? ? ?Current Outpatient Medications:  ?  albuterol (VENTOLIN HFA) 108 (90 Base) MCG/ACT inhaler, Inhale 2 puffs into the lungs every 6 (six) hours as needed for wheezing or shortness of breath., Disp: 8 g, Rfl: 2 ?  buPROPion (WELLBUTRIN XL) 150 MG 24 hr tablet, Take 1 tablet (150 mg total) by mouth every morning., Disp: 30 tablet, Rfl: 2 ?  nicotine (NICODERM CQ - DOSED IN MG/24 HOURS) 21 mg/24hr patch, Place 1 patch (21 mg total) onto the skin daily., Disp: 30 patch, Rfl: 1  ? ?Allergies  ?Allergen Reactions  ? Banana Itching  ? Penicillins Hives  ?  ? ?Review of Systems  ?Constitutional: Negative.   ?Respiratory: Negative.    ?Cardiovascular: Negative.  Negative for chest pain, palpitations and leg swelling.  ?Gastrointestinal: Negative.   ?Skin:   ?     She has left breast pain and discoloration -  3 weeks ago, 1 week ago and was hot to touch. She did not get seen for this problem.   ?Neurological: Negative.   ?Psychiatric/Behavioral: Negative.     ? ?Today's Vitals  ? 01/14/22 1134  ?BP: 134/80  ?Pulse: 85  ?Temp: 98.9 ?F (37.2 ?C)  ?TempSrc: Oral  ?Weight: (!) 389 lb (176.4 kg)  ?  Height: 5' 7" (1.702 m)  ? ?Body mass index is 60.93 kg/m?.  ? ?Objective:  ?Physical Exam ?Vitals reviewed.  ?Constitutional:   ?   General: She is not in acute distress. ?   Appearance: Normal appearance. She is obese.  ?HENT:  ?   Head: Normocephalic.  ?Cardiovascular:  ?   Rate and Rhythm: Normal rate and regular rhythm.  ?   Pulses: Normal pulses.  ?   Heart sounds: Normal heart sounds. No murmur heard. ?Pulmonary:  ?   Effort: Pulmonary effort is normal. No respiratory distress.  ?   Breath sounds: Normal breath sounds. No wheezing.  ?Musculoskeletal:     ?   General: Normal range of motion.  ?Skin: ?   General: Skin is warm and dry.  ?   Capillary Refill: Capillary refill takes less than 2 seconds.  ?Neurological:  ?   General: No focal deficit  present.  ?   Mental Status: She is alert and oriented to person, place, and time.  ?   Cranial Nerves: No cranial nerve deficit.  ?   Motor: No weakness.  ?Psychiatric:     ?   Mood and Affect: Mood normal.     ?   Behavior: Behavior normal.     ?   Thought Content: Thought content normal.     ?   Judgment: Judgment normal.  ?  ? ?   ?Assessment And Plan:  ?   ?1. Moderate episode of recurrent major depressive disorder ([place]) ?Comments: Will start her on Wellbutrin, depression screen is 12. Denies suicidal or homicidal ideations, will refer to psychiatry.  ?- Ambulatory referral to Psychiatry ?- buPROPion (WELLBUTRIN XL) 150 MG 24 hr tablet; Take 1 tablet (150 mg total) by mouth every morning.  Dispense: 30 tablet; Refill: 2 ? ?2. Tobacco abuse ?Smoking cessation instruction/counseling given:  counseled patient on the dangers of tobacco use, advised patient to stop smoking, and reviewed strategies to maximize success ?- nicotine (NICODERM CQ - DOSED IN MG/24 HOURS) 21 mg/24hr patch; Place 1 patch (21 mg total) onto the skin daily.  Dispense: 30 patch; Refill: 1 ? ?3. Prediabetes ?Comments: Will check to see if has elevated HgbA1c.  ?- Hemoglobin A1c ?- CMP14+EGFR ? ?4. Screening for STD (sexually transmitted disease) ? ?5. Encounter for hepatitis C screening test for low risk patient ?Will check Hepatitis C screening due to recent recommendations to screen all adults 18 years and older ?- Hepatitis C antibody ? ?6. Encounter for gynecological examination ?- Ambulatory referral to Obstetrics / Gynecology ? ?7. Encounter for HIV (human immunodeficiency virus) test ?- HIV Antibody (routine testing w rflx) ? ?8. Establishing care with new doctor, encounter for ? ?9. History of asthma ?  ? ? ?Patient was given opportunity to ask questions. Patient verbalized understanding of the plan and was able to repeat key elements of the plan. All questions were answered to their satisfaction.  ?Minette Brine, FNP  ? ?I, Minette Brine, FNP, have reviewed all documentation for this visit. The documentation on 01/14/22 for the exam, diagnosis, procedures, and orders are all accurate and complete.  ? ?IF YOU HAVE BEEN REFERRED TO A SPECIALIST, IT MAY TAKE 1-2 WEEKS TO SCHEDULE/PROCESS THE REFERRAL. IF YOU HAVE NOT HEARD FROM US/SPECIALIST IN TWO WEEKS, PLEASE GIVE [place] A CALL AT [phone].  ? ?THE PATIENT IS ENCOURAGED TO PRACTICE SOCIAL DISTANCING DUE TO THE COVID-19 PANDEMIC.   ?

## 2022-01-14 NOTE — Patient Instructions (Signed)
Breast Tenderness Breast tenderness is a common problem for women of all ages, but may also occur in men. Breast tenderness may range from mild discomfort to severe pain. In women, the pain usually comes and goes with the menstrual cycle, but it can also be constant. Breast tenderness has many possible causes, including hormone changes, infections, and taking certain medicines. You may have tests, such as a mammogram or an ultrasound, to check for any unusual findings. Having breast tenderness usually does not mean that you have breast cancer. Follow these instructions at home: Managing pain and discomfort  If directed, put ice to the painful area. To do this: Put ice in a plastic bag. Place a towel between your skin and the bag. Leave the ice on for 20 minutes, 2-3 times a day. Wear a supportive bra, especially during exercise. You may also want to wear a supportive bra while sleeping if your breasts are very tender. Medicines Take over-the-counter and prescription medicines only as told by your health care provider. If the cause of your pain is infection, you may be prescribed an antibiotic medicine. If you were prescribed an antibiotic, take it as told by your health care provider. Do not stop taking the antibiotic even if you start to feel better. Eating and drinking Your health care provider may recommend that you lessen the amount of fat in your diet. You can do this by: Limiting fried foods. Cooking foods using methods such as baking, boiling, grilling, and broiling. Decrease the amount of caffeine in your diet. Instead, drink more water and choose caffeine-free drinks. General instructions  Keep a log of the days and times when your breasts are most tender. Ask your health care provider how to do breast exams at home. This will help you notice if you have an unusual growth or lump. Keep all follow-up visits as told by your health care provider. This is important. Contact a health care  provider if: Any part of your breast is hard, red, and hot to the touch. This may be a sign of infection. You are a woman and: Not breastfeeding and you have fluid, especially blood or pus, coming out of your nipples. Have a new or painful lump in your breast that remains after your menstrual period ends. You have a fever. Your pain does not improve or it gets worse. Your pain is interfering with your daily activities. Summary Breast tenderness may range from mild discomfort to severe pain. Breast tenderness has many possible causes, including hormone changes, infections, and taking certain medicines. It can be treated with ice, wearing a supportive bra, and medicines. Make changes to your diet if told to by your health care provider. This information is not intended to replace advice given to you by your health care provider. Make sure you discuss any questions you have with your health care provider. Document Revised: 02/22/2019 Document Reviewed: 02/22/2019 Elsevier Patient Education  2022 Elsevier Inc.  

## 2022-01-15 LAB — CMP14+EGFR
ALT: 22 IU/L (ref 0–32)
AST: 21 IU/L (ref 0–40)
Albumin/Globulin Ratio: 1.4 (ref 1.2–2.2)
Albumin: 4.6 g/dL (ref 3.9–5.0)
Alkaline Phosphatase: 77 IU/L (ref 44–121)
BUN/Creatinine Ratio: 11 (ref 9–23)
BUN: 8 mg/dL (ref 6–20)
Bilirubin Total: 0.3 mg/dL (ref 0.0–1.2)
CO2: 23 mmol/L (ref 20–29)
Calcium: 9.6 mg/dL (ref 8.7–10.2)
Chloride: 101 mmol/L (ref 96–106)
Creatinine, Ser: 0.71 mg/dL (ref 0.57–1.00)
Globulin, Total: 3.2 g/dL (ref 1.5–4.5)
Glucose: 77 mg/dL (ref 70–99)
Potassium: 4.7 mmol/L (ref 3.5–5.2)
Sodium: 138 mmol/L (ref 134–144)
Total Protein: 7.8 g/dL (ref 6.0–8.5)
eGFR: 122 mL/min/{1.73_m2} (ref >59)

## 2022-01-15 LAB — HIV ANTIBODY (ROUTINE TESTING W REFLEX): HIV Screen 4th Generation wRfx: NONREACTIVE

## 2022-01-15 LAB — HEPATITIS C ANTIBODY: Hep C Virus Ab: NONREACTIVE

## 2022-01-15 LAB — HEMOGLOBIN A1C
Est. average glucose Bld gHb Est-mCnc: 114 mg/dL
Hgb A1c MFr Bld: 5.6 % (ref 4.8–5.6)

## 2022-02-17 ENCOUNTER — Other Ambulatory Visit: Payer: Self-pay | Admitting: Nurse Practitioner

## 2022-02-17 DIAGNOSIS — F331 Major depressive disorder, recurrent, moderate: Secondary | ICD-10-CM

## 2022-02-28 ENCOUNTER — Ambulatory Visit: Payer: Commercial Managed Care - PPO | Admitting: Nurse Practitioner

## 2022-03-20 ENCOUNTER — Ambulatory Visit: Payer: Commercial Managed Care - PPO | Admitting: Nurse Practitioner

## 2022-04-23 ENCOUNTER — Encounter: Payer: Commercial Managed Care - PPO | Admitting: Nurse Practitioner

## 2022-04-23 NOTE — Progress Notes (Deleted)
  I,Dula Havlik T Shelbee Apgar,acting as a Neurosurgeon for Arnette Felts, FNP.,have documented all relevant documentation on the behalf of Arnette Felts, FNP,as directed by  Arnette Felts, FNP while in the presence of Arnette Felts, FNP.  Subjective:     Patient ID: Kellie Schmidt , adult    DOB: 09/09/1996 , 26 y.o.   MRN: [id number]   No chief complaint on file.   HPI  Pt presents today for med f/u.     Past Medical History:  Diagnosis Date   Anemia    Borderline diabetic    Depression    Hyperlipemia    Morbid obesity ([facility])    Wheezing      Family History  Problem Relation Age of Onset   Diabetes Mother    Diabetes Father    Asthma Father    Hyperlipidemia Maternal Grandmother    Hypertension Maternal Grandmother    Diabetes Maternal Grandfather    Cancer Maternal Grandfather      Current Outpatient Medications:    albuterol (VENTOLIN HFA) 108 (90 Base) MCG/ACT inhaler, Inhale 2 puffs into the lungs every 6 (six) hours as needed for wheezing or shortness of breath., Disp: 8 g, Rfl: 2   buPROPion (WELLBUTRIN XL) 150 MG 24 hr tablet, TAKE 1 TABLET BY MOUTH EVERY DAY IN THE MORNING, Disp: 90 tablet, Rfl: 1   nicotine (NICODERM CQ - DOSED IN MG/24 HOURS) 21 mg/24hr patch, Place 1 patch (21 mg total) onto the skin daily., Disp: 30 patch, Rfl: 1   Allergies  Allergen Reactions   Banana Itching   Penicillins Hives     Review of Systems  Constitutional: Negative.   Respiratory: Negative.    Cardiovascular: Negative.   Neurological: Negative.   Psychiatric/Behavioral: Negative.       There were no vitals filed for this visit. There is no height or weight on file to calculate BMI.   Objective:  Physical Exam      Assessment And Plan:     There are no diagnoses linked to this encounter.    Patient was given opportunity to ask questions. Patient verbalized understanding of the plan and was able to repeat key elements of the plan. All questions were answered to their  satisfaction.  Coolidge Breeze, CMA   I, Coolidge Breeze, CMA, have reviewed all documentation for this visit. The documentation on 04/23/22 for the exam, diagnosis, procedures, and orders are all accurate and complete.   IF YOU HAVE BEEN REFERRED TO A SPECIALIST, IT MAY TAKE 1-2 WEEKS TO SCHEDULE/PROCESS THE REFERRAL. IF YOU HAVE NOT HEARD FROM US/SPECIALIST IN TWO WEEKS, PLEASE GIVE [place] A CALL AT [phone].   THE PATIENT IS ENCOURAGED TO PRACTICE SOCIAL DISTANCING DUE TO THE COVID-19 PANDEMIC.

## 2022-05-01 ENCOUNTER — Emergency Department
Admission: EM | Admit: 2022-05-01 | Discharge: 2022-05-01 | Disposition: A | Discharge status: Home / Self Care | Payer: Commercial Managed Care - PPO | Attending: Emergency Medicine | Admitting: Emergency Medicine

## 2022-05-01 ENCOUNTER — Other Ambulatory Visit: Payer: Self-pay

## 2022-05-01 ENCOUNTER — Encounter: Payer: Self-pay | Admitting: Emergency Medicine

## 2022-05-01 DIAGNOSIS — M545 Low back pain, unspecified: Secondary | ICD-10-CM | POA: Insufficient documentation

## 2022-05-01 LAB — URINALYSIS, ROUTINE W REFLEX MICROSCOPIC
Bilirubin Urine: NEGATIVE
Glucose, UA: NEGATIVE mg/dL
Hgb urine dipstick: NEGATIVE
Ketones, ur: NEGATIVE mg/dL
Leukocytes,Ua: NEGATIVE
Nitrite: NEGATIVE
Protein, ur: NEGATIVE mg/dL
Specific Gravity, Urine: 1.028 (ref 1.005–1.030)
pH: 5 (ref 5.0–8.0)

## 2022-05-01 LAB — POC URINE PREG, ED: Preg Test, Ur: NEGATIVE

## 2022-05-01 MED ORDER — LIDOCAINE 5 % EX PTCH
1.0000 | MEDICATED_PATCH | CUTANEOUS | Status: DC
  Administered 2022-05-01: 1 via TRANSDERMAL
  Filled 2022-05-01: qty 1

## 2022-05-01 MED ORDER — LIDOCAINE 5 % EX PTCH: 1.0000 | MEDICATED_PATCH | Freq: Two times a day (BID) | CUTANEOUS | 0 refills | Status: AC

## 2022-05-01 MED ORDER — ACETAMINOPHEN 325 MG PO TABS
650.0000 mg | ORAL_TABLET | Freq: Once | ORAL | Status: AC
  Administered 2022-05-01: 650 mg via ORAL
  Filled 2022-05-01: qty 2

## 2022-05-01 NOTE — ED Notes (Signed)
Pt verbalized understanding of discharge instructions, prescription, and follow-up care instructions. Pt advised if symptoms worsen to return to ED.  

## 2022-05-01 NOTE — Discharge Instructions (Signed)
You may use the Lidoderm patches as we discussed. Please return to the emergency department for any new, worsening, or changing symptoms or other concerns including weakness in your legs, urinary or stool incontinence or retention, numbness or tingling in your extremities/buttocks/groin, fevers, or any other concerns or change in symptoms. It was a pleasure caring for you today.

## 2022-05-01 NOTE — ED Triage Notes (Signed)
C/O right lower back pain pain x 3 days.

## 2022-05-01 NOTE — ED Provider Notes (Signed)
[facility] Provider Note    Event Date/Time   First MD Initiated Contact with Patient 05/01/22 1557     (approximate)   History   Back Pain   HPI  Kellie Schmidt is a 26 y.o. adult with a past medical history of obesity presents today for evaluation of low back pain.  Patient reports that this is an ongoing problem for her, however her most recent exacerbation started on Monday.  She reports that it is primarily on her left side but spreads across her low back.  She denies any radiation of pain into her legs or into her abdomen.  She denies burning with urination, urinary frequency or urgency, fevers or chills.  She reports that she has "back spasms" routinely, and her pain feels the same as this today.  She reports that she has been laying down a lot recently.  No IV drug use.  No fevers.  No leg weakness.  No urinary or fecal incontinence or retention or saddle anesthesia.  There are no problems to display for this patient.         Physical Exam   Triage Vital Signs: ED Triage Vitals  Enc Vitals Group     BP 05/01/22 1526 140/90     Pulse Rate 05/01/22 1526 (!) 102     Resp 05/01/22 1526 20     Temp 05/01/22 1526 98.6 F (37 C)     Temp Source 05/01/22 1526 Oral     SpO2 05/01/22 1526 99 %     Weight 05/01/22 1518 (!) 388 lb 14.3 oz (176.4 kg)     Height 05/01/22 1518 5\' 7"  (1.702 m)     Head Circumference --      Peak Flow --      Pain Score 05/01/22 1517 8     Pain Loc --      Pain Edu? --      Excl. in GC? --     Most recent vital signs: Vitals:   05/01/22 1526 05/01/22 1751  BP: 140/90 (!) 119/93  Pulse: (!) 102 95  Resp: 20 19  Temp: 98.6 F (37 C) 98.9 F (37.2 C)  SpO2: 99% 100%    Physical Exam Vitals and nursing note reviewed.  Constitutional:      General: Awake and alert. No acute distress.    Appearance: Normal appearance. The patient is morbidly obese.  HENT:     Head: Normocephalic and atraumatic.      Mouth: Mucous membranes are moist.  Eyes:     General: PERRL. Normal EOMs        Right eye: No discharge.        Left eye: No discharge.     Conjunctiva/sclera: Conjunctivae normal.  Cardiovascular:     Rate and Rhythm: Normal rate and regular rhythm.     Pulses: Normal pulses.     Heart sounds: Normal heart sounds Pulmonary:     Effort: Pulmonary effort is normal. No respiratory distress.     Breath sounds: Normal breath sounds.  Abdominal:     Abdomen is soft. There is no abdominal tenderness. No rebound or guarding. No distention. Musculoskeletal:        General: No swelling. Normal range of motion.     Cervical back: Normal range of motion and neck supple.  Back: No midline vertebral tenderness. Left and right sided lumbar paraspinal muscle tenderness. Strength and sensation 5/5 to bilateral lower extremities. Normal great toe  extension against resistance. Normal sensation throughout feet.  Negative SLR and opposite SLR bilaterally. Normal gait Skin:    General: Skin is warm and dry.     Capillary Refill: Capillary refill takes less than 2 seconds.     Findings: No rash.  Neurological:     Mental Status: The patient is awake and alert.      ED Results / Procedures / Treatments   Labs (all labs ordered are listed, but only abnormal results are displayed) Labs Reviewed  URINALYSIS, ROUTINE W REFLEX MICROSCOPIC - Abnormal; Notable for the following components:      Result Value   Color, Urine YELLOW (*)    APPearance HAZY (*)    All other components within normal limits  POC URINE PREG, ED     EKG     RADIOLOGY     PROCEDURES:  Critical Care performed:   Procedures   MEDICATIONS ORDERED IN ED: Medications  lidocaine (LIDODERM) 5 % 1 patch (1 patch Transdermal Patch Applied 05/01/22 1632)  lidocaine (LIDODERM) 5 % 1 patch (1 patch Transdermal Patch Applied 05/01/22 1704)  acetaminophen (TYLENOL) tablet 650 mg (650 mg Oral Given 05/01/22 1632)      IMPRESSION / MDM / ASSESSMENT AND PLAN / ED COURSE  I reviewed the triage vital signs and the nursing notes.   Differential diagnosis includes, but is not limited to, back spasm, msk etiology, lumbar radiculopathy, UTI/pyelo. Patient is awake and alert, hemodynamically stable and afebrile.  She is nontoxic in appearance.  She has 5 out of 5 strength with intact sensation to extensor hallucis dorsiflexion and plantarflexion of bilateral lower extremities with normal patellar reflexes bilaterally. Most likely etiology at this point is muscle strain vs herniated disc. No red flags to indicate patient is at risk for more auspicious process that would require urgent/emergent spinal imaging or subspecialty evaluation at this time. No major trauma, no midline tenderness, no history or physical exam findings to suggest cauda equina syndrome or spinal cord compression. No focal neurological deficits on exam. No constitutional symptoms or history of immunosuppression or IVDA to suggest potential for epidural abscess. Not anticoagulated, no history of bleeding diastasis to suggest risk for epidural hematoma. No chronic steroid use or advanced age or history of malignancy to suggest proclivity towards pathological fracture.  No abdominal pain or flank pain to suggest kidney stone, no history of kidney stone.  No fever or dysuria or CVAT to suggest pyelonephritis .  No chest pain, back pain, shortness of breath, neurological deficits, to suggest vascular catastrophe, and pulses are equal in all 4 extremities.  Discussed care instructions and return precautions with patient. Recommended close outpatient follow-up for re-evaluation. Patient agrees with plan of care. Will treat the patient symptomatically as needed for pain control.  Patient did not wish to have anything stronger than Lidoderm patches.  These provided significant relief for her.  She requested a prescription for them which was also provided.  She  reports that her pain has nearly resolved by the time of discharge.  Will discharge patient to take these medications and return for any worsening or different pain or development of any neurologic symptoms. Educated patient regarding expected time course for back pain to improve and recommended very close outpatient follow-up.  Patient understands and agrees with plan.  Discharged in stable condition.    Patient's presentation is most consistent with exacerbation of chronic illness.  Clinical Course as of 05/01/22 1801  Wed May 01, 2022  1656 Patient  feels improved with lido patch but is requesting a second one [JP]  [id number] Patient reports she feels back to baseline.  She is ambulatory around the room and reports that she feels significantly improved [JP]    Clinical Course User Index [JP] Ignazio Kincaid, Clarnce Flock, PA-C     FINAL CLINICAL IMPRESSION(S) / ED DIAGNOSES   Final diagnoses:  Acute left-sided low back pain without sciatica     Rx / DC Orders   ED Discharge Orders          Ordered    lidocaine (LIDODERM) 5 %  Every 12 hours        05/01/22 1726             Note:  This document was prepared using Dragon voice recognition software and may include unintentional dictation errors.   Emeline Gins 05/01/22 Johnnye Lana    Blake Divine, MD 05/01/22 [phone]

## 2022-05-08 ENCOUNTER — Telehealth: Payer: Self-pay

## 2022-05-08 NOTE — Telephone Encounter (Signed)
Spoke with patient after visiting the ED, patient states she is okay. The pain she reports has now gone away she knows what she has to do. Patient states the ED did satisfy all her needs and she does not need an follow up appointment at this time.

## 2022-05-22 NOTE — Progress Notes (Signed)
Not seen

## 2022-06-11 ENCOUNTER — Ambulatory Visit (LOCAL_COMMUNITY_HEALTH_CENTER): Payer: Medicaid Other

## 2022-06-11 ENCOUNTER — Other Ambulatory Visit: Payer: Self-pay

## 2022-06-11 DIAGNOSIS — Z111 Encounter for screening for respiratory tuberculosis: Secondary | ICD-10-CM

## 2022-06-14 ENCOUNTER — Ambulatory Visit (LOCAL_COMMUNITY_HEALTH_CENTER): Payer: Self-pay

## 2022-06-14 ENCOUNTER — Ambulatory Visit
Admission: RE | Admit: 2022-06-14 | Discharge: 2022-06-14 | Disposition: A | Discharge status: Home / Self Care | Payer: Self-pay | Source: Ambulatory Visit | Attending: Surgery | Admitting: Surgery

## 2022-06-14 ENCOUNTER — Other Ambulatory Visit: Payer: Self-pay | Admitting: Surgery

## 2022-06-14 DIAGNOSIS — R7611 Nonspecific reaction to tuberculin skin test without active tuberculosis: Secondary | ICD-10-CM

## 2022-06-14 LAB — TB SKIN TEST
Induration: 10 mm
TB Skin Test: POSITIVE

## 2022-06-14 NOTE — Progress Notes (Signed)
In nurse clinic for PPDR.  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  Epi done today.  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 No previous TB testing.  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 Hx asthma and uses albuterol prn with last dose one year ago. Hx chronic back pain. smokes daily.   RN explained positive PPD results and need for chest xray.  Dr Levonne Hubert made in person visit with pt and explained latent vs Active TB and instructions for chest xray. Dr Wyvonnia Lora plans to f-u with pt once chest xray results are in.   Latent vs Active info sheet given. TB coord contact card given. Pt plans to have chest xray today.Once chest xray results are ready, pt will need note for work.  Questions answered and reports understanding. Jerel Shepherd, RN

## 2022-06-18 ENCOUNTER — Telehealth: Payer: Self-pay | Admitting: Surgery

## 2022-06-18 ENCOUNTER — Other Ambulatory Visit: Payer: Medicaid Other

## 2022-06-18 DIAGNOSIS — R7611 Nonspecific reaction to tuberculin skin test without active tuberculosis: Secondary | ICD-10-CM

## 2022-06-18 NOTE — Telephone Encounter (Signed)
Called pt, she is between low and medium risk and had a 27mm read for her recent PPD. Will follow up with no charge QFT to verify borderline results. Patient to come into clinic today for blood draw.  Jennye Moccasin, MD

## 2022-06-18 NOTE — Progress Notes (Signed)
Patient is between low and medium risk and had a 38mm read for her recent PPD. Will follow up with no charge QFT to verify borderline results. Patient walked to lab for blood draw.   Offer tx for LTBI pending results.  Jennye Moccasin, MD

## 2022-06-21 ENCOUNTER — Telehealth: Payer: Self-pay | Admitting: Surgery

## 2022-06-21 DIAGNOSIS — Z111 Encounter for screening for respiratory tuberculosis: Secondary | ICD-10-CM

## 2022-06-21 LAB — QUANTIFERON-TB GOLD PLUS
QuantiFERON Mitogen Value: >10 IU/mL
QuantiFERON Nil Value: 0.02 IU/mL
QuantiFERON TB1 Ag Value: 0.02 IU/mL
QuantiFERON TB2 Ag Value: 0.05 IU/mL
QuantiFERON-TB Gold Plus: NEGATIVE

## 2022-06-21 NOTE — Telephone Encounter (Signed)
Patient informed that QFT results are clearly negative (had previous borderline +PPD), negative QFT results printed out and left at info booth for her to pickup, patient does not have latent TB.  Wyvonnia Lora, Alessandra Bevels, MD

## 2022-07-18 ENCOUNTER — Encounter: Payer: Self-pay | Admitting: Emergency Medicine

## 2022-07-18 ENCOUNTER — Emergency Department: Payer: Self-pay

## 2022-07-18 ENCOUNTER — Other Ambulatory Visit: Payer: Self-pay

## 2022-07-18 ENCOUNTER — Emergency Department
Admission: EM | Admit: 2022-07-18 | Discharge: 2022-07-18 | Disposition: A | Discharge status: Home / Self Care | Payer: Self-pay | Attending: Emergency Medicine | Admitting: Emergency Medicine

## 2022-07-18 DIAGNOSIS — Z7951 Long term (current) use of inhaled steroids: Secondary | ICD-10-CM | POA: Insufficient documentation

## 2022-07-18 DIAGNOSIS — J209 Acute bronchitis, unspecified: Secondary | ICD-10-CM | POA: Insufficient documentation

## 2022-07-18 DIAGNOSIS — J40 Bronchitis, not specified as acute or chronic: Secondary | ICD-10-CM

## 2022-07-18 DIAGNOSIS — Z20822 Contact with and (suspected) exposure to covid-19: Secondary | ICD-10-CM | POA: Insufficient documentation

## 2022-07-18 LAB — GROUP A STREP BY PCR: Group A Strep by PCR: NOT DETECTED

## 2022-07-18 LAB — RESP PANEL BY RT-PCR (FLU A&B, COVID) ARPGX2
Influenza A by PCR: NEGATIVE
Influenza B by PCR: NEGATIVE
SARS Coronavirus 2 by RT PCR: NEGATIVE

## 2022-07-18 MED ORDER — ALBUTEROL SULFATE HFA 108 (90 BASE) MCG/ACT IN AERS: 2.0000 | INHALATION_SPRAY | Freq: Four times a day (QID) | RESPIRATORY_TRACT | 2 refills | Status: DC | PRN

## 2022-07-18 MED ORDER — IPRATROPIUM-ALBUTEROL 0.5-2.5 (3) MG/3ML IN SOLN
3.0000 mL | Freq: Once | RESPIRATORY_TRACT | Status: AC
  Administered 2022-07-18: 3 mL via RESPIRATORY_TRACT
  Filled 2022-07-18: qty 3

## 2022-07-18 MED ORDER — PREDNISONE 50 MG PO TABS: ORAL_TABLET | ORAL | 0 refills | Status: DC

## 2022-07-18 MED ORDER — AZITHROMYCIN 250 MG PO TABS: ORAL_TABLET | ORAL | 0 refills | Status: AC

## 2022-07-18 MED ORDER — BENZONATATE 100 MG PO CAPS: 100.0000 mg | ORAL_CAPSULE | Freq: Three times a day (TID) | ORAL | 0 refills | Status: AC | PRN

## 2022-07-18 NOTE — Discharge Instructions (Addendum)
Take prednisone once daily for 5 days. Z-Pak as directed. You have been prescribed an albuterol inhaler.

## 2022-07-18 NOTE — ED Provider Notes (Signed)
[facility] Provider Note  Patient Contact: 5:13 PM (approximate)   History   Sore Throat   HPI  Kellie Schmidt is a 26 y.o. adult presents to the emergency department with cough productive for purulent sputum production which started on Friday.  Patient states that her symptoms started off with pharyngitis and other viral URI-like symptoms.  She states that she was evaluated in [facility] and had negative COVID-19 and group A strep testing.  She states that she does have some chest tightness and has noticed some wheezing when she lays down.  No nausea, vomiting or abdominal pain.      Physical Exam   Triage Vital Signs: ED Triage Vitals  Enc Vitals Group     BP 07/18/22 1618 (!) 144/115     Pulse Rate 07/18/22 1618 (!) 102     Resp 07/18/22 1618 20     Temp 07/18/22 1618 98.8 F (37.1 C)     Temp Source 07/18/22 1618 Oral     SpO2 07/18/22 1618 96 %     Weight 07/18/22 1558 (!) 396 lb (179.6 kg)     Height 07/18/22 1558 5\' 7"  (1.702 m)     Head Circumference --      Peak Flow --      Pain Score 07/18/22 1558 10     Pain Loc --      Pain Edu? --      Excl. in GC? --     Most recent vital signs: Vitals:   07/18/22 1618  BP: (!) 144/115  Pulse: (!) 102  Resp: 20  Temp: 98.8 F (37.1 C)  SpO2: 96%     General: Alert and in no acute distress. Eyes:  PERRL. EOMI. Head: No acute traumatic findings ENT:      Nose: No congestion/rhinnorhea.      Mouth/Throat: Mucous membranes are moist. Neck: No stridor. No cervical spine tenderness to palpation. Cardiovascular:  Good peripheral perfusion Respiratory: Normal respiratory effort without tachypnea or retractions.  Faint expiratory wheezing auscultated bilaterally.  Good air entry to the bases with no decreased or absent breath sounds. Gastrointestinal: Bowel sounds 4 quadrants. Soft and nontender to palpation. No guarding or rigidity. No palpable masses. No distention. No CVA  tenderness. Musculoskeletal: Full range of motion to all extremities.  Neurologic:  No gross focal neurologic deficits are appreciated.  Skin:   No rash noted   ED Results / Procedures / Treatments   Labs (all labs ordered are listed, but only abnormal results are displayed) Labs Reviewed  RESP PANEL BY RT-PCR (FLU A&B, COVID) ARPGX2  GROUP A STREP BY PCR       RADIOLOGY  I personally viewed and evaluated these images as part of my medical decision making, as well as reviewing the written report by the radiologist.  ED Provider Interpretation: No consolidations, opacities or infiltrates.   PROCEDURES:  Critical Care performed: No  Procedures   MEDICATIONS ORDERED IN ED: Medications  ipratropium-albuterol (DUONEB) 0.5-2.5 (3) MG/3ML nebulizer solution 3 mL (has no administration in time range)     IMPRESSION / MDM / ASSESSMENT AND PLAN / ED COURSE  I reviewed the triage vital signs and the nursing notes.                              Assessment and plan Cough 26 year old female presents to the emergency department with cough that started on Friday.  Patient was tachycardic and mildly tachypneic at triage with faint expiratory wheezing auscultated.  Differential diagnosis includes viral URI, bronchitis, pneumonia   COVID-19 and influenza negative.  Group A strep testing negative.  No signs of pneumonia on chest x-ray.  Patient was given a DuoNeb in the emergency department.  She was prescribed azithromycin, prednisone and an albuterol inhaler as well as Best boy.  Recommended combining Tessalon Perles with Mucinex DM over-the-counter.  A work note was provided.  Return precautions were given to return with new or worsening symptoms  FINAL CLINICAL IMPRESSION(S) / ED DIAGNOSES   Final diagnoses:  Bronchitis     Rx / DC Orders   ED Discharge Orders          Ordered    predniSONE (DELTASONE) 50 MG tablet        07/18/22 1817    azithromycin  (ZITHROMAX Z-PAK) 250 MG tablet        07/18/22 1817    albuterol (VENTOLIN HFA) 108 (90 Base) MCG/ACT inhaler  Every 6 hours PRN        07/18/22 1817             Note:  This document was prepared using Dragon voice recognition software and may include unintentional dictation errors.   Vallarie Mare [place], PA-C 07/18/22 Alphonzo Dublin, MD 07/18/22 1906

## 2022-07-18 NOTE — ED Triage Notes (Signed)
Pt via POV from home. Pt c/o sore throat, R ear clogged, fever, cough, and congestion since Friday. States that she was seen at [facility] on Saturday and everything including COVID and strep were negative. Pt is A&Ox4 nad NAD

## 2022-07-23 ENCOUNTER — Telehealth: Payer: Self-pay

## 2022-07-23 NOTE — Telephone Encounter (Addendum)
Transition Care Management Unsuccessful Follow-up Telephone Call  Date of discharge and from where:  07/18/2022  Attempts:  1st Attempt  Reason for unsuccessful TCM follow-up call:  Left voice message    Called patient trying to complete TCM. Patient declined completing at this time, she states she will give the office a call back.

## 2022-07-31 ENCOUNTER — Encounter: Payer: Self-pay | Admitting: Nurse Practitioner

## 2022-08-15 ENCOUNTER — Encounter: Payer: Self-pay | Admitting: Emergency Medicine

## 2022-08-15 ENCOUNTER — Emergency Department
Admission: EM | Admit: 2022-08-15 | Discharge: 2022-08-15 | Disposition: A | Discharge status: Home / Self Care | Payer: Medicaid Other | Attending: Emergency Medicine | Admitting: Emergency Medicine

## 2022-08-15 ENCOUNTER — Emergency Department: Payer: Medicaid Other

## 2022-08-15 ENCOUNTER — Other Ambulatory Visit: Payer: Self-pay

## 2022-08-15 DIAGNOSIS — R439 Unspecified disturbances of smell and taste: Secondary | ICD-10-CM | POA: Insufficient documentation

## 2022-08-15 DIAGNOSIS — J45909 Unspecified asthma, uncomplicated: Secondary | ICD-10-CM | POA: Insufficient documentation

## 2022-08-15 DIAGNOSIS — Z20822 Contact with and (suspected) exposure to covid-19: Secondary | ICD-10-CM | POA: Insufficient documentation

## 2022-08-15 DIAGNOSIS — J069 Acute upper respiratory infection, unspecified: Secondary | ICD-10-CM | POA: Insufficient documentation

## 2022-08-15 LAB — RESP PANEL BY RT-PCR (FLU A&B, COVID) ARPGX2
Influenza A by PCR: NEGATIVE
Influenza B by PCR: NEGATIVE
SARS Coronavirus 2 by RT PCR: NEGATIVE

## 2022-08-15 LAB — GROUP A STREP BY PCR: Group A Strep by PCR: NOT DETECTED

## 2022-08-15 MED ORDER — PREDNISONE 20 MG PO TABS: 40.0000 mg | ORAL_TABLET | Freq: Every day | ORAL | 0 refills | Status: AC

## 2022-08-15 MED ORDER — ALBUTEROL SULFATE HFA 108 (90 BASE) MCG/ACT IN AERS: 2.0000 | INHALATION_SPRAY | Freq: Four times a day (QID) | RESPIRATORY_TRACT | 2 refills | Status: AC | PRN

## 2022-08-15 MED ORDER — AZITHROMYCIN 250 MG PO TABS: ORAL_TABLET | ORAL | 0 refills | Status: DC

## 2022-08-15 MED ORDER — ALBUTEROL SULFATE (2.5 MG/3ML) 0.083% IN NEBU: 2.5000 mg | INHALATION_SOLUTION | Freq: Four times a day (QID) | RESPIRATORY_TRACT | 0 refills | Status: AC | PRN

## 2022-08-15 MED ORDER — ACETAMINOPHEN 500 MG PO TABS
1000.0000 mg | ORAL_TABLET | Freq: Once | ORAL | Status: AC
  Administered 2022-08-15: 1000 mg via ORAL
  Filled 2022-08-15: qty 2

## 2022-08-15 MED ORDER — IPRATROPIUM-ALBUTEROL 0.5-2.5 (3) MG/3ML IN SOLN
3.0000 mL | Freq: Once | RESPIRATORY_TRACT | Status: AC
  Administered 2022-08-15: 3 mL via RESPIRATORY_TRACT
  Filled 2022-08-15: qty 3

## 2022-08-15 MED ORDER — BENZONATATE 100 MG PO CAPS: ORAL_CAPSULE | ORAL | 0 refills | Status: DC

## 2022-08-15 NOTE — Discharge Instructions (Signed)
Take the prescription meds as directed. Follow-up with your primary provider as needed.

## 2022-08-15 NOTE — ED Provider Notes (Signed)
[facility] Emergency Department Provider Note     Event Date/Time   First MD Initiated Contact with Patient 08/15/22 2133     (approximate)   History   Cough, Headache, Generalized Body Aches, and Sore Throat   HPI  Kellie Schmidt is a 26 y.o. adult who presents to the ED with plaints of a productive cough, sore throat, headaches, generalized sweats.  They also complain of loss of taste sensation.  Patient reports symptoms since Monday.  They also give a history of asthma.     Physical Exam   Triage Vital Signs: ED Triage Vitals  Enc Vitals Group     BP 08/15/22 2031 (!) 152/95     Pulse Rate 08/15/22 2031 89     Resp 08/15/22 2031 18     Temp 08/15/22 2031 100.3 F (37.9 C)     Temp Source 08/15/22 2031 Oral     SpO2 08/15/22 2031 95 %     Weight --      Height --      Head Circumference --      Peak Flow --      Pain Score 08/15/22 2032 8     Pain Loc --      Pain Edu? --      Excl. in [place]? --     Most recent vital signs: Vitals:   08/15/22 2031 08/15/22 2224  BP: (!) 152/95   Pulse: 89   Resp: 18   Temp: 100.3 F (37.9 C) 99.1 F (37.3 C)  SpO2: 95%     General Awake, no distress. NAD HEENT NCAT. PERRL. EOMI. No rhinorrhea. Mucous membranes are moist.  Uvula is midline and tonsils are flat.  No oropharyngeal lesions are appreciated.  Left TM with serous effusion appreciated.  Right TM obscured by soft wax. CV:  Good peripheral perfusion.  RESP:  Normal effort.  Bilateral wheeze and rhonchi appreciated ABD:  No distention.    ED Results / Procedures / Treatments   Labs (all labs ordered are listed, but only abnormal results are displayed) Labs Reviewed  RESP PANEL BY RT-PCR (FLU A&B, COVID) ARPGX2  GROUP A STREP BY PCR     EKG    RADIOLOGY  I personally viewed and evaluated these images as part of my medical decision making, as well as reviewing the written report by the radiologist.  ED Provider  Interpretation: no acute findings   CXR   IMPRESSION: No active cardiopulmonary disease.   PROCEDURES:  Critical Care performed: No  Procedures   MEDICATIONS ORDERED IN ED: Medications  acetaminophen (TYLENOL) tablet 1,000 mg (1,000 mg Oral Given 08/15/22 2044)  ipratropium-albuterol (DUONEB) 0.5-2.5 (3) MG/3ML nebulizer solution 3 mL (3 mLs Nebulization Given 08/15/22 2158)     IMPRESSION / MDM / ASSESSMENT AND PLAN / ED COURSE  I reviewed the triage vital signs and the nursing notes.                              Differential diagnosis includes, but is not limited to,viral syndrome, bronchitis including COPD exacerbation, pneumonia, reactive airway disease including asthma, CHF including exacerbation with or without pulmonary/interstitial edema, pneumothorax  Patient's presentation is most consistent with acute complicated illness / injury requiring diagnostic workup.  Patient to the ED for evaluation of persistent cough and shortness of breath patient was evaluated for complaints in ED, found a reassuring exam work-up.  Patient with some exam evidence of some significant bilateral rhonchi and wheeze.  Her chest x-ray however was negative based on my review without any signs of any acute consolidation.  She also endorsed some improvement of symptoms after DuoNeb treatment given in the ED.  Patient's viral panel screen was negative as well as her group strep.  Patient's diagnosis is consistent with viral URI bronchitis. Patient will be discharged home with prescriptions for prednisone, Tessalon Perles, albuterol MDI, azithromycin, and a nebulizer machine for home use. Patient is to follow up with her primary provider as needed or otherwise directed. Patient is given ED precautions to return to the ED for any worsening or new symptoms.     FINAL CLINICAL IMPRESSION(S) / ED DIAGNOSES   Final diagnoses:  Viral upper respiratory tract infection     Rx / DC Orders   ED  Discharge Orders          Ordered    predniSONE (DELTASONE) 20 MG tablet  Daily with breakfast        08/15/22 2206    benzonatate (TESSALON PERLES) 100 MG capsule        08/15/22 2206    albuterol (VENTOLIN HFA) 108 (90 Base) MCG/ACT inhaler  Every 6 hours PRN        08/15/22 2206    azithromycin (ZITHROMAX Z-PAK) 250 MG tablet        08/15/22 2206    For home use only DME Nebulizer machine        08/15/22 2214    albuterol (PROVENTIL) (2.5 MG/3ML) 0.083% nebulizer solution  Every 6 hours PRN        08/15/22 2218             Note:  This document was prepared using Dragon voice recognition software and may include unintentional dictation errors.    Lissa Hoard, PA-C 08/17/22 Ivor Reining    Shaune Pollack, MD 08/18/22 [phone]

## 2022-08-15 NOTE — ED Notes (Signed)
ED Provider at bedside. 

## 2022-08-15 NOTE — ED Triage Notes (Signed)
Pt to ED from home c/o productive yellow cough, sore throat, headache, sweats, and loss of taste since Monday.  Works at a school.  Hx of asthma.

## 2022-09-13 DIAGNOSIS — Z419 Encounter for procedure for purposes other than remedying health state, unspecified: Secondary | ICD-10-CM | POA: Diagnosis not present

## 2022-10-14 DIAGNOSIS — Z419 Encounter for procedure for purposes other than remedying health state, unspecified: Secondary | ICD-10-CM | POA: Diagnosis not present

## 2022-11-14 DIAGNOSIS — Z419 Encounter for procedure for purposes other than remedying health state, unspecified: Secondary | ICD-10-CM | POA: Diagnosis not present

## 2022-12-13 DIAGNOSIS — Z419 Encounter for procedure for purposes other than remedying health state, unspecified: Secondary | ICD-10-CM | POA: Diagnosis not present

## 2022-12-30 DIAGNOSIS — J309 Allergic rhinitis, unspecified: Secondary | ICD-10-CM | POA: Diagnosis not present

## 2022-12-30 DIAGNOSIS — H9313 Tinnitus, bilateral: Secondary | ICD-10-CM | POA: Diagnosis not present

## 2023-01-02 DIAGNOSIS — J069 Acute upper respiratory infection, unspecified: Secondary | ICD-10-CM | POA: Diagnosis not present

## 2023-01-13 DIAGNOSIS — Z419 Encounter for procedure for purposes other than remedying health state, unspecified: Secondary | ICD-10-CM | POA: Diagnosis not present

## 2023-02-07 ENCOUNTER — Other Ambulatory Visit: Payer: Self-pay

## 2023-02-07 ENCOUNTER — Emergency Department: Payer: Medicaid Other

## 2023-02-07 ENCOUNTER — Emergency Department
Admission: EM | Admit: 2023-02-07 | Discharge: 2023-02-07 | Disposition: A | Discharge status: Home / Self Care | Payer: Medicaid Other | Attending: Emergency Medicine | Admitting: Emergency Medicine

## 2023-02-07 ENCOUNTER — Encounter: Payer: Self-pay | Admitting: Emergency Medicine

## 2023-02-07 DIAGNOSIS — K5730 Diverticulosis of large intestine without perforation or abscess without bleeding: Secondary | ICD-10-CM | POA: Diagnosis not present

## 2023-02-07 DIAGNOSIS — R110 Nausea: Secondary | ICD-10-CM

## 2023-02-07 DIAGNOSIS — R109 Unspecified abdominal pain: Secondary | ICD-10-CM

## 2023-02-07 DIAGNOSIS — R10A2 Flank pain, left side: Secondary | ICD-10-CM

## 2023-02-07 LAB — URINALYSIS, ROUTINE W REFLEX MICROSCOPIC
Bilirubin Urine: NEGATIVE
Glucose, UA: NEGATIVE mg/dL
Hgb urine dipstick: NEGATIVE
Ketones, ur: NEGATIVE mg/dL
Leukocytes,Ua: NEGATIVE
Nitrite: NEGATIVE
Protein, ur: NEGATIVE mg/dL
Specific Gravity, Urine: 1.028 (ref 1.005–1.030)
pH: 6 (ref 5.0–8.0)

## 2023-02-07 LAB — CBC WITH DIFFERENTIAL/PLATELET
Abs Immature Granulocytes: 0.03 10*3/uL (ref 0.00–0.07)
Basophils Absolute: 0 10*3/uL (ref 0.0–0.1)
Basophils Relative: 0 %
Eosinophils Absolute: 0.2 10*3/uL (ref 0.0–0.5)
Eosinophils Relative: 2 %
HCT: 38.6 % (ref 36.0–46.0)
Hemoglobin: 12.5 g/dL (ref 12.0–15.0)
Immature Granulocytes: 0 %
Lymphocytes Relative: 42 %
Lymphs Abs: 4.3 10*3/uL — ABNORMAL HIGH (ref 0.7–4.0)
MCH: 26.9 pg (ref 26.0–34.0)
MCHC: 32.4 g/dL (ref 30.0–36.0)
MCV: 83 fL (ref 80.0–100.0)
Monocytes Absolute: 0.8 10*3/uL (ref 0.1–1.0)
Monocytes Relative: 8 %
Neutro Abs: 4.8 10*3/uL (ref 1.7–7.7)
Neutrophils Relative %: 48 %
Platelets: 325 10*3/uL (ref 150–400)
RBC: 4.65 MIL/uL (ref 3.87–5.11)
RDW: 14.4 % (ref 11.5–15.5)
WBC: 10.2 10*3/uL (ref 4.0–10.5)
nRBC: 0 % (ref 0.0–0.2)

## 2023-02-07 LAB — LIPASE, BLOOD: Lipase: 24 U/L (ref 11–51)

## 2023-02-07 LAB — COMPREHENSIVE METABOLIC PANEL
ALT: 16 U/L (ref 0–44)
AST: 19 U/L (ref 15–41)
Albumin: 4 g/dL (ref 3.5–5.0)
Alkaline Phosphatase: 61 U/L (ref 38–126)
Anion gap: 8 (ref 5–15)
BUN: 9 mg/dL (ref 6–20)
CO2: 26 mmol/L (ref 22–32)
Calcium: 9.4 mg/dL (ref 8.9–10.3)
Chloride: 103 mmol/L (ref 98–111)
Creatinine, Ser: 0.89 mg/dL (ref 0.44–1.00)
GFR, Estimated: >60 mL/min (ref >60)
Glucose, Bld: 103 mg/dL — ABNORMAL HIGH (ref 70–99)
Potassium: 3.6 mmol/L (ref 3.5–5.1)
Sodium: 137 mmol/L (ref 135–145)
Total Bilirubin: 0.5 mg/dL (ref 0.3–1.2)
Total Protein: 8.1 g/dL (ref 6.5–8.1)

## 2023-02-07 LAB — HCG, QUANTITATIVE, PREGNANCY: hCG, Beta Chain, Quant, S: <1 m[IU]/mL (ref <5)

## 2023-02-07 MED ORDER — ONDANSETRON HCL 4 MG/2ML IJ SOLN
4.0000 mg | Freq: Once | INTRAMUSCULAR | Status: AC
  Administered 2023-02-07: 4 mg via INTRAVENOUS
  Filled 2023-02-07: qty 2

## 2023-02-07 MED ORDER — ONDANSETRON 8 MG PO TBDP: 8.0000 mg | ORAL_TABLET | Freq: Three times a day (TID) | ORAL | 0 refills | Status: DC | PRN

## 2023-02-07 MED ORDER — HYDROCODONE-ACETAMINOPHEN 5-325 MG PO TABS: 1.0000 | ORAL_TABLET | Freq: Four times a day (QID) | ORAL | 0 refills | Status: DC | PRN

## 2023-02-07 MED ORDER — MORPHINE SULFATE (PF) 4 MG/ML IV SOLN
4.0000 mg | Freq: Once | INTRAVENOUS | Status: AC
  Administered 2023-02-07: 4 mg via INTRAVENOUS
  Filled 2023-02-07: qty 1

## 2023-02-07 NOTE — ED Provider Notes (Signed)
[facility] Provider Note    Event Date/Time   First MD Initiated Contact with Patient 02/07/23 [phone]     (approximate)   History   Abdominal Pain   HPI  Kellie Schmidt is a 27 y.o. adult   Past medical history of borderline diabetes, depression, hyperlipidemia, increased BMI, who presents to the emergency department with left-sided flank/abdominal pain sudden onset while sleeping around 3 AM.  Has no history of kidney stones and never has had pain like this before.  She states that it is in the left flank wraps around to the left abdomen and shoots down towards the groin.  No urinary symptoms.  Some nausea but no vomiting.  Aside from a viral URI symptoms that have been mostly resolved over this last week she has had no other acute medical complaints or recent illnesses.  Independent Historian contributed to assessment above: Her mother who is at bedside corroborates information given above     Physical Exam   Triage Vital Signs: ED Triage Vitals [02/07/23 0555]  Enc Vitals Group     BP      Pulse      Resp      Temp      Temp src      SpO2      Weight (!) 379 lb (171.9 kg)     Height 5\' 7"  (1.702 m)     Head Circumference      Peak Flow      Pain Score 10     Pain Loc      Pain Edu?      Excl. in GC?     Most recent vital signs: Vitals:   02/07/23 0601  BP: (!) 123/102  Pulse: 87  Resp: 18  Temp: 98.4 F (36.9 C)  SpO2: 100%    General: Awake, tearful laying in the stretcher appears to be in pain CV:  Good peripheral perfusion.  Resp:  Normal effort.  Abd:  No distention.  Soft nontender abdomen.    ED Results / Procedures / Treatments   Labs (all labs ordered are listed, but only abnormal results are displayed) Labs Reviewed  CBC WITH DIFFERENTIAL/PLATELET - Abnormal; Notable for the following components:      Result Value   Lymphs Abs 4.3 (*)    All other components within normal limits  COMPREHENSIVE METABOLIC  PANEL - Abnormal; Notable for the following components:   Glucose, Bld 103 (*)    All other components within normal limits  LIPASE, BLOOD  URINALYSIS, ROUTINE W REFLEX MICROSCOPIC  HCG, QUANTITATIVE, PREGNANCY  POC URINE PREG, ED     I ordered and reviewed the above labs they are notable for her white blood cell count is 10.2 and her H&H is normal  EKG      PROCEDURES:  Critical Care performed: No  Procedures   MEDICATIONS ORDERED IN ED: Medications  morphine (PF) 4 MG/ML injection 4 mg (4 mg Intravenous Given 02/07/23 0613)  ondansetron (ZOFRAN) injection 4 mg (4 mg Intravenous Given 02/07/23 1027)    IMPRESSION / MDM / ASSESSMENT AND PLAN / ED COURSE  I reviewed the triage vital signs and the nursing notes.                                Patient's presentation is most consistent with acute presentation with potential threat to life or bodily function.  Differential diagnosis includes, but is not limited to, ureterolithiasis, obstructive uropathy, pyelonephritis, urinary tract infection, diverticulitis, GERD/gastritis, ovarian torsion   The patient is on the cardiac monitor to evaluate for evidence of arrhythmia and/or significant heart rate changes.  MDM: Patient with left-sided flank abdominal pain radiating down to the groin sudden onset of sleep most consistent with ureteral stone but also consider diverticulitis, ovarian torsion, other intra-abdominal surgical pathologies I will obtain a CT scan renal stone protocol as well as basic labs LFTs and lipase and a urinalysis.  Pain control with IV morphine and antiemetic IV Zofran.  Since this clinical picture is consistent with renal stone I think worked up as a renal stone at first.  Ovarian torsion is a consideration, so if CT scan is nondiagnostic and shows ovarian enlargement I think proceeding with a transvaginal ultrasound to rule out torsion would be a good next step.         FINAL CLINICAL IMPRESSION(S)  / ED DIAGNOSES   Final diagnoses:  Left flank pain  Abdominal pain, left lateral  Nausea     Rx / DC Orders   ED Discharge Orders     None        Note:  This document was prepared using Dragon voice recognition software and may include unintentional dictation errors.    Pilar Jarvis, MD 02/07/23 [phone]

## 2023-02-07 NOTE — Discharge Instructions (Addendum)
Please use ibuprofen (Motrin) up to 800 mg every 8 hours, naproxen (Naprosyn) up to 500 mg every 12 hours, and/or acetaminophen (Tylenol) up to 4 g/day for any continued pain.  Please do not use this medication regimen for longer than 7 days 

## 2023-02-07 NOTE — ED Provider Notes (Signed)
Emergency department handoff note  Care of this patient was signed out to me at the end of the previous provider shift.  All pertinent patient information was conveyed and all questions were answered.  Patient pending CT of the abdomen and pelvis does not show any evidence of acute abnormalities.  Upon reassessment patient is resting comfortably and all questions were answered to the best my ability  The patient has been reexamined and is ready to be discharged.  All diagnostic results have been reviewed and discussed with the patient/family.  Care plan has been outlined and the patient/family understands all current diagnoses, results, and treatment plans.  There are no new complaints, changes, or physical findings at this time.  All questions have been addressed and answered.  All medications, if any, that were given while in the emergency department or any that are being prescribed have been reviewed with the patient/family.  All side effects and adverse reactions have been explained.  Patient was instructed to, and agrees to follow-up with their primary care physician as well as return to the emergency department if any new or worsening symptoms develop.   Merwyn Katos, MD 02/07/23 1012

## 2023-02-07 NOTE — ED Triage Notes (Signed)
Pt to triage via w/c, tearful, appears uncomfortable; c/o left sided abd pain accomp by  nausea that awoke her PTA; denies hx of same

## 2023-02-12 ENCOUNTER — Telehealth: Payer: Self-pay

## 2023-02-12 DIAGNOSIS — Z419 Encounter for procedure for purposes other than remedying health state, unspecified: Secondary | ICD-10-CM | POA: Diagnosis not present

## 2023-02-12 NOTE — Transitions of Care (Post Inpatient/ED Visit) (Signed)
   02/12/2023  Name: Kellie Schmidt MRN: [id number] DOB: Feb 23, 1996  Today's TOC FU Call Status: Today's TOC FU Call Status:: Unsuccessul Call (1st Attempt) Unsuccessful Call (1st Attempt) Date: 02/12/23  Attempted to reach the patient regarding the most recent Inpatient/ED visit.  Follow Up Plan: Additional outreach attempts will be made to reach the patient to complete the Transitions of Care (Post Inpatient/ED visit) call.   Signature YL,RMA

## 2023-02-24 NOTE — Telephone Encounter (Signed)
error 

## 2023-03-15 DIAGNOSIS — Z419 Encounter for procedure for purposes other than remedying health state, unspecified: Secondary | ICD-10-CM | POA: Diagnosis not present

## 2023-03-29 ENCOUNTER — Emergency Department
Admission: EM | Admit: 2023-03-29 | Discharge: 2023-03-29 | Disposition: A | Discharge status: Home / Self Care | Payer: Medicaid Other | Attending: Emergency Medicine | Admitting: Emergency Medicine

## 2023-03-29 ENCOUNTER — Other Ambulatory Visit: Payer: Self-pay

## 2023-03-29 DIAGNOSIS — H60391 Other infective otitis externa, right ear: Secondary | ICD-10-CM | POA: Diagnosis not present

## 2023-03-29 DIAGNOSIS — H6121 Impacted cerumen, right ear: Secondary | ICD-10-CM | POA: Diagnosis not present

## 2023-03-29 DIAGNOSIS — H9201 Otalgia, right ear: Secondary | ICD-10-CM | POA: Diagnosis present

## 2023-03-29 MED ORDER — CARBAMIDE PEROXIDE 6.5 % OT SOLN: 5.0000 [drp] | Freq: Two times a day (BID) | OTIC | 2 refills | Status: AC

## 2023-03-29 MED ORDER — NEOMYCIN-POLYMYXIN-HC 3.5-10000-1 OT SOLN: 4.0000 [drp] | Freq: Four times a day (QID) | OTIC | 0 refills | Status: AC

## 2023-03-29 MED ORDER — NEOMYCIN-POLYMYXIN-HC 3.5-10000-1 OT SOLN
4.0000 [drp] | Freq: Four times a day (QID) | OTIC | Status: DC
  Administered 2023-03-29: 4 [drp] via OTIC
  Filled 2023-03-29: qty 10

## 2023-03-29 NOTE — ED Triage Notes (Signed)
Pt to ed from home via POV for ear pain in the right ear. Pt tried to clean out the ear and it got worse. Feels clogged. Pt is caox4, in no acute distress and ambulatory in triage.

## 2023-03-29 NOTE — ED Provider Notes (Signed)
[facility] Provider Note  Patient Contact: 10:58 PM (approximate)   History   Otalgia (Right)   HPI  Kellie Schmidt is a 27 y.o. adult who presents the emergency department complaining of right ear pain.  Patient has a history of increased earwax to her ears, typically uses hydrogen peroxide with relief.  Patient states that she was having more of a full sensation, did not feel like the hydrogen peroxide was working so she used Q-tips.  Now she has some pain, increased muffled hearing to the right ear.     Physical Exam   Triage Vital Signs: ED Triage Vitals  Enc Vitals Group     BP 03/29/23 1949 (!) 137/107     Pulse Rate 03/29/23 1949 93     Resp 03/29/23 1949 16     Temp 03/29/23 1949 98.5 F (36.9 C)     Temp Source 03/29/23 1949 Oral     SpO2 03/29/23 1949 98 %     Weight 03/29/23 1950 (!) 375 lb (170.1 kg)     Height 03/29/23 1950 5\' 6"  (1.676 m)     Head Circumference --      Peak Flow --      Pain Score 03/29/23 1949 4     Pain Loc --      Pain Edu? --      Excl. in GC? --     Most recent vital signs: Vitals:   03/29/23 1949  BP: (!) 137/107  Pulse: 93  Resp: 16  Temp: 98.5 F (36.9 C)  SpO2: 98%     General: Alert and in no acute distress. ENT:      Ears: EAC is erythematous and edematous of the right with cerumen impaction.  Findings consistent with otitis externa, left side is unremarkable      Nose: No congestion/rhinnorhea.      Mouth/Throat: Mucous membranes are moist.  Cardiovascular:  Good peripheral perfusion Respiratory: Normal respiratory effort without tachypnea or retractions. Lungs CTAB.  Musculoskeletal: Full range of motion to all extremities.  Neurologic:  No gross focal neurologic deficits are appreciated.  Skin:   No rash noted Other:   ED Results / Procedures / Treatments   Labs (all labs ordered are listed, but only abnormal results are displayed) Labs Reviewed - No data to  display   EKG     RADIOLOGY    No results found.  PROCEDURES:  Critical Care performed: No  Procedures   MEDICATIONS ORDERED IN ED: Medications  neomycin-polymyxin-hydrocortisone (CORTISPORIN) OTIC (EAR) solution 4 drop (4 drops Right EAR Given 03/29/23 2250)     IMPRESSION / MDM / ASSESSMENT AND PLAN / ED COURSE  I reviewed the triage vital signs and the nursing notes.                                 Differential diagnosis includes, but is not limited to, otitis externa, otitis media, cerumen impaction   Patient's presentation is most consistent with acute presentation with potential threat to life or bodily function.   Patient's diagnosis is consistent with otitis externa, cerumen impaction.  Patient presents to the emergency department with ongoing cerumen issues, also had some increased pain after using Q-tips.  Findings were consistent with otitis externa and cerumen impaction.  Antibiotic eardrops are emergency department.  After finishing a week of these patient should use Debrox for the cerumen.  Patient  is agreeable with this plan.  Follow-up primary care as needed.  Patient is given ED precautions to return to the ED for any worsening or new symptoms.     FINAL CLINICAL IMPRESSION(S) / ED DIAGNOSES   Final diagnoses:  Other infective acute otitis externa of right ear  Impacted cerumen of right ear     Rx / DC Orders   ED Discharge Orders          Ordered    neomycin-polymyxin-hydrocortisone (CORTISPORIN) OTIC solution  4 times daily        03/29/23 2302    carbamide peroxide (DEBROX) 6.5 % OTIC solution  2 times daily        03/29/23 2303             Note:  This document was prepared using Dragon voice recognition software and may include unintentional dictation errors.   Lanette Hampshire 03/29/23 2303    Georga Hacking, MD 03/30/23 [phone]

## 2023-04-14 DIAGNOSIS — Z419 Encounter for procedure for purposes other than remedying health state, unspecified: Secondary | ICD-10-CM | POA: Diagnosis not present

## 2023-05-15 DIAGNOSIS — Z419 Encounter for procedure for purposes other than remedying health state, unspecified: Secondary | ICD-10-CM | POA: Diagnosis not present

## 2023-05-19 DIAGNOSIS — F3289 Other specified depressive episodes: Secondary | ICD-10-CM | POA: Diagnosis not present

## 2023-05-19 DIAGNOSIS — F419 Anxiety disorder, unspecified: Secondary | ICD-10-CM | POA: Diagnosis not present

## 2023-06-03 DIAGNOSIS — J019 Acute sinusitis, unspecified: Secondary | ICD-10-CM | POA: Diagnosis not present

## 2023-06-03 DIAGNOSIS — Z20822 Contact with and (suspected) exposure to covid-19: Secondary | ICD-10-CM | POA: Diagnosis not present

## 2023-06-03 DIAGNOSIS — R059 Cough, unspecified: Secondary | ICD-10-CM | POA: Diagnosis not present

## 2023-06-03 DIAGNOSIS — J209 Acute bronchitis, unspecified: Secondary | ICD-10-CM | POA: Diagnosis not present

## 2023-06-15 DIAGNOSIS — Z419 Encounter for procedure for purposes other than remedying health state, unspecified: Secondary | ICD-10-CM | POA: Diagnosis not present

## 2023-07-15 DIAGNOSIS — Z419 Encounter for procedure for purposes other than remedying health state, unspecified: Secondary | ICD-10-CM | POA: Diagnosis not present

## 2023-08-15 DIAGNOSIS — Z419 Encounter for procedure for purposes other than remedying health state, unspecified: Secondary | ICD-10-CM | POA: Diagnosis not present

## 2023-09-02 ENCOUNTER — Encounter (HOSPITAL_COMMUNITY): Payer: Self-pay | Admitting: Emergency Medicine

## 2023-09-02 ENCOUNTER — Emergency Department (HOSPITAL_COMMUNITY)
Admission: EM | Admit: 2023-09-02 | Discharge: 2023-09-02 | Discharge status: Against Medical Advice | Payer: Medicaid Other | Attending: Emergency Medicine | Admitting: Emergency Medicine

## 2023-09-02 DIAGNOSIS — M79644 Pain in right finger(s): Secondary | ICD-10-CM | POA: Insufficient documentation

## 2023-09-02 DIAGNOSIS — R5383 Other fatigue: Secondary | ICD-10-CM | POA: Insufficient documentation

## 2023-09-02 DIAGNOSIS — Z5321 Procedure and treatment not carried out due to patient leaving prior to being seen by health care provider: Secondary | ICD-10-CM | POA: Diagnosis not present

## 2023-09-02 LAB — CBC
HCT: 36.8 % (ref 36.0–46.0)
Hemoglobin: 11.9 g/dL — ABNORMAL LOW (ref 12.0–15.0)
MCH: 27.3 pg (ref 26.0–34.0)
MCHC: 32.3 g/dL (ref 30.0–36.0)
MCV: 84.4 fL (ref 80.0–100.0)
Platelets: 267 10*3/uL (ref 150–400)
RBC: 4.36 MIL/uL (ref 3.87–5.11)
RDW: 14.1 % (ref 11.5–15.5)
WBC: 7.1 10*3/uL (ref 4.0–10.5)
nRBC: 0 % (ref 0.0–0.2)

## 2023-09-02 LAB — BASIC METABOLIC PANEL
Anion gap: 9 (ref 5–15)
BUN: 9 mg/dL (ref 6–20)
CO2: 23 mmol/L (ref 22–32)
Calcium: 9.2 mg/dL (ref 8.9–10.3)
Chloride: 105 mmol/L (ref 98–111)
Creatinine, Ser: 0.75 mg/dL (ref 0.44–1.00)
GFR, Estimated: >60 mL/min (ref >60)
Glucose, Bld: 96 mg/dL (ref 70–99)
Potassium: 3.8 mmol/L (ref 3.5–5.1)
Sodium: 137 mmol/L (ref 135–145)

## 2023-09-02 LAB — HCG, SERUM, QUALITATIVE: Preg, Serum: NEGATIVE

## 2023-09-02 LAB — CBG MONITORING, ED: Glucose-Capillary: 90 mg/dL (ref 70–99)

## 2023-09-02 NOTE — ED Triage Notes (Signed)
Patient arrives in wheelchair by POV with mother c/o lack of energy and fatigue today.c/o right ring finger pain after having a splinter yesterday that mother was able to remove. Mother is concerned for sleep apnea.

## 2023-09-02 NOTE — ED Notes (Signed)
Patient informed registration they were leaving.

## 2023-09-08 DIAGNOSIS — R202 Paresthesia of skin: Secondary | ICD-10-CM | POA: Diagnosis not present

## 2023-09-08 DIAGNOSIS — G5603 Carpal tunnel syndrome, bilateral upper limbs: Secondary | ICD-10-CM | POA: Diagnosis not present

## 2023-09-14 DIAGNOSIS — Z419 Encounter for procedure for purposes other than remedying health state, unspecified: Secondary | ICD-10-CM | POA: Diagnosis not present

## 2023-09-16 DIAGNOSIS — Z113 Encounter for screening for infections with a predominantly sexual mode of transmission: Secondary | ICD-10-CM | POA: Diagnosis not present

## 2023-09-16 DIAGNOSIS — J309 Allergic rhinitis, unspecified: Secondary | ICD-10-CM | POA: Diagnosis not present

## 2023-10-15 DIAGNOSIS — Z419 Encounter for procedure for purposes other than remedying health state, unspecified: Secondary | ICD-10-CM | POA: Diagnosis not present

## 2023-10-26 DIAGNOSIS — Z419 Encounter for procedure for purposes other than remedying health state, unspecified: Secondary | ICD-10-CM | POA: Diagnosis not present

## 2023-10-28 ENCOUNTER — Emergency Department (HOSPITAL_BASED_OUTPATIENT_CLINIC_OR_DEPARTMENT_OTHER): Payer: Medicaid Other | Admitting: Radiology

## 2023-10-28 ENCOUNTER — Encounter (HOSPITAL_BASED_OUTPATIENT_CLINIC_OR_DEPARTMENT_OTHER): Payer: Self-pay | Admitting: Emergency Medicine

## 2023-10-28 ENCOUNTER — Emergency Department (HOSPITAL_BASED_OUTPATIENT_CLINIC_OR_DEPARTMENT_OTHER)
Admission: EM | Admit: 2023-10-28 | Discharge: 2023-10-28 | Disposition: A | Discharge status: Home / Self Care | Payer: Medicaid Other | Attending: Emergency Medicine | Admitting: Emergency Medicine

## 2023-10-28 ENCOUNTER — Other Ambulatory Visit: Payer: Self-pay

## 2023-10-28 DIAGNOSIS — J069 Acute upper respiratory infection, unspecified: Secondary | ICD-10-CM | POA: Insufficient documentation

## 2023-10-28 DIAGNOSIS — J029 Acute pharyngitis, unspecified: Secondary | ICD-10-CM | POA: Diagnosis not present

## 2023-10-28 DIAGNOSIS — B9789 Other viral agents as the cause of diseases classified elsewhere: Secondary | ICD-10-CM | POA: Diagnosis not present

## 2023-10-28 DIAGNOSIS — Z20822 Contact with and (suspected) exposure to covid-19: Secondary | ICD-10-CM | POA: Insufficient documentation

## 2023-10-28 DIAGNOSIS — R059 Cough, unspecified: Secondary | ICD-10-CM | POA: Diagnosis not present

## 2023-10-28 LAB — GROUP A STREP BY PCR: Group A Strep by PCR: NOT DETECTED

## 2023-10-28 LAB — RESP PANEL BY RT-PCR (RSV, FLU A&B, COVID)  RVPGX2
Influenza A by PCR: NEGATIVE
Influenza B by PCR: NEGATIVE
Resp Syncytial Virus by PCR: NEGATIVE
SARS Coronavirus 2 by RT PCR: NEGATIVE

## 2023-10-28 MED ORDER — BENZONATATE 100 MG PO CAPS: 100.0000 mg | ORAL_CAPSULE | Freq: Three times a day (TID) | ORAL | 0 refills | Status: DC

## 2023-10-28 MED ORDER — DEXAMETHASONE SODIUM PHOSPHATE 10 MG/ML IJ SOLN
10.0000 mg | Freq: Once | INTRAMUSCULAR | Status: AC
  Administered 2023-10-28: 10 mg via INTRAMUSCULAR
  Filled 2023-10-28: qty 1

## 2023-10-28 NOTE — ED Triage Notes (Signed)
 Cough, sore throat. Cold chills  Started Friday No relief with otc meds

## 2023-10-28 NOTE — Discharge Instructions (Addendum)
 Please refer to the attached instructions

## 2023-10-28 NOTE — ED Provider Notes (Signed)
 [place] EMERGENCY DEPARTMENT AT [facility] Provider Note   CSN: [id number] Arrival date & time: 10/28/23  1820     History  Chief Complaint  Patient presents with   Sore Throat    Kellie Schmidt is a 28 y.o. adult.  Patient presents with cough, runny nose, sore throat, occasional fever and chills, myalgias onset over the weekend. Was exposed to a child with a cough on Friday. Nausea with one episode of vomiting Saturday, now resolved. Denies chest pain, abdominal pain, urinary symptoms. Cough occasionally productive with green and brown sputum.  The history is provided by the patient.  Sore Throat This is a new problem. The current episode started more than 2 days ago. The problem has not changed since onset.Pertinent negatives include no abdominal pain and no shortness of breath.       Home Medications Prior to Admission medications   Medication Sig Start Date End Date Taking? Authorizing Provider  albuterol  (PROVENTIL ) (2.5 MG/3ML) 0.083% nebulizer solution Take 3 mLs (2.5 mg total) by nebulization every 6 (six) hours as needed for wheezing or shortness of breath. 08/15/22   Menshew, Candida LULLA Kings, PA-C  albuterol  (VENTOLIN  HFA) 108 (90 Base) MCG/ACT inhaler Inhale 2 puffs into the lungs every 6 (six) hours as needed for wheezing or shortness of breath. 08/15/22   Menshew, Candida LULLA Kings, PA-C  azithromycin  (ZITHROMAX  Z-PAK) 250 MG tablet Take 2 tablets (500 mg) on  Day 1,  followed by 1 tablet (250 mg) once daily on Days 2 through 5. 08/15/22   Menshew, Jenise V Bacon, PA-C  benzonatate  (TESSALON  PERLES) 100 MG capsule Take 1-2 tabs TID prn cough 08/15/22   Menshew, Jenise V Bacon, PA-C  buPROPion  (WELLBUTRIN  XL) 150 MG 24 hr tablet TAKE 1 TABLET BY MOUTH EVERY DAY IN THE MORNING Patient not taking: Reported on 06/11/2022 02/18/22   Georgina Speaks, FNP  carbamide peroxide (DEBROX) 6.5 % OTIC solution Place 5 drops into both ears 2 (two) times daily. 03/29/23 03/28/24   Cuthriell, Jonathan D, PA-C  HYDROcodone -acetaminophen  (NORCO) 5-325 MG tablet Take 1 tablet by mouth every 6 (six) hours as needed for severe pain. 02/07/23   Bradler, Evan K, MD  ondansetron  (ZOFRAN -ODT) 8 MG disintegrating tablet Take 1 tablet (8 mg total) by mouth every 8 (eight) hours as needed for nausea or vomiting. 02/07/23   Bradler, Evan K, MD      Allergies    Banana and Penicillins    Review of Systems   Review of Systems  Constitutional:  Positive for fatigue and fever.  HENT:  Positive for postnasal drip, rhinorrhea and sore throat.   Respiratory:  Positive for cough. Negative for shortness of breath.   Gastrointestinal:  Negative for abdominal pain.  Genitourinary:  Negative for dysuria.  Musculoskeletal:  Positive for myalgias.  Skin:  Negative for rash.    Physical Exam Updated Vital Signs BP (!) 141/89 (BP Location: Left Wrist)   Pulse 98   Temp 98.3 F (36.8 C) (Oral)   Resp 18   LMP 10/26/2023   SpO2 99%  Physical Exam  ED Results / Procedures / Treatments   Labs (all labs ordered are listed, but only abnormal results are displayed) Labs Reviewed  GROUP A STREP BY PCR  RESP PANEL BY RT-PCR (RSV, FLU A&B, COVID)  RVPGX2    EKG None  Radiology DG Chest 2 View Result Date: 10/28/2023 CLINICAL DATA:  Cough and sore throat. EXAM: CHEST - 2 VIEW COMPARISON:  August 15, 2022 FINDINGS: The heart size and mediastinal contours are within normal limits. Both lungs are clear. The visualized skeletal structures are unremarkable. IMPRESSION: No active cardiopulmonary disease. Electronically Signed   By: Suzen Dials M.D.   On: 10/28/2023 23:08    Procedures Procedures    Medications Ordered in ED Medications  dexamethasone  (DECADRON ) injection 10 mg (has no administration in time range)    ED Course/ Medical Decision Making/ A&P                                 Medical Decision Making Amount and/or Complexity of Data Reviewed Radiology:  ordered.  Risk Prescription drug management.   Pt symptoms consistent with URI. CXR negative for acute infiltrate. Pt will be discharged with symptomatic treatment.  Discussed return precautions.  Pt is hemodynamically stable & in NAD prior to discharge.         Final Clinical Impression(s) / ED Diagnoses Final diagnoses:  Viral URI with cough    Rx / DC Orders ED Discharge Orders          Ordered    benzonatate  (TESSALON ) 100 MG capsule  Every 8 hours        10/28/23 2336              Claudene Lenis, NP 10/28/23 2337    Yolande Lamar BROCKS, MD 10/29/23 [phone]

## 2023-10-31 DIAGNOSIS — J019 Acute sinusitis, unspecified: Secondary | ICD-10-CM | POA: Diagnosis not present

## 2023-10-31 DIAGNOSIS — J209 Acute bronchitis, unspecified: Secondary | ICD-10-CM | POA: Diagnosis not present

## 2023-10-31 DIAGNOSIS — J189 Pneumonia, unspecified organism: Secondary | ICD-10-CM | POA: Diagnosis not present

## 2023-11-15 DIAGNOSIS — Z419 Encounter for procedure for purposes other than remedying health state, unspecified: Secondary | ICD-10-CM | POA: Diagnosis not present

## 2023-11-26 DIAGNOSIS — Z419 Encounter for procedure for purposes other than remedying health state, unspecified: Secondary | ICD-10-CM | POA: Diagnosis not present

## 2023-12-02 DIAGNOSIS — G5603 Carpal tunnel syndrome, bilateral upper limbs: Secondary | ICD-10-CM | POA: Diagnosis not present

## 2023-12-09 ENCOUNTER — Other Ambulatory Visit: Payer: Self-pay

## 2023-12-09 ENCOUNTER — Emergency Department (HOSPITAL_COMMUNITY): Payer: Medicaid Other

## 2023-12-09 ENCOUNTER — Emergency Department (HOSPITAL_COMMUNITY)
Admission: EM | Admit: 2023-12-09 | Discharge: 2023-12-09 | Discharge status: Against Medical Advice | Payer: Medicaid Other | Attending: Emergency Medicine | Admitting: Emergency Medicine

## 2023-12-09 ENCOUNTER — Encounter (HOSPITAL_COMMUNITY): Payer: Self-pay

## 2023-12-09 DIAGNOSIS — Z1152 Encounter for screening for COVID-19: Secondary | ICD-10-CM | POA: Insufficient documentation

## 2023-12-09 DIAGNOSIS — H538 Other visual disturbances: Secondary | ICD-10-CM | POA: Insufficient documentation

## 2023-12-09 DIAGNOSIS — Z5321 Procedure and treatment not carried out due to patient leaving prior to being seen by health care provider: Secondary | ICD-10-CM | POA: Insufficient documentation

## 2023-12-09 DIAGNOSIS — R519 Headache, unspecified: Secondary | ICD-10-CM | POA: Diagnosis not present

## 2023-12-09 DIAGNOSIS — R110 Nausea: Secondary | ICD-10-CM | POA: Insufficient documentation

## 2023-12-09 DIAGNOSIS — R197 Diarrhea, unspecified: Secondary | ICD-10-CM | POA: Diagnosis not present

## 2023-12-09 DIAGNOSIS — G4489 Other headache syndrome: Secondary | ICD-10-CM | POA: Diagnosis not present

## 2023-12-09 DIAGNOSIS — I10 Essential (primary) hypertension: Secondary | ICD-10-CM | POA: Diagnosis not present

## 2023-12-09 DIAGNOSIS — Z743 Need for continuous supervision: Secondary | ICD-10-CM | POA: Diagnosis not present

## 2023-12-09 DIAGNOSIS — R112 Nausea with vomiting, unspecified: Secondary | ICD-10-CM | POA: Diagnosis not present

## 2023-12-09 LAB — RESP PANEL BY RT-PCR (RSV, FLU A&B, COVID)  RVPGX2
Influenza A by PCR: NEGATIVE
Influenza B by PCR: NEGATIVE
Resp Syncytial Virus by PCR: NEGATIVE
SARS Coronavirus 2 by RT PCR: NEGATIVE

## 2023-12-09 MED ORDER — DIPHENHYDRAMINE HCL 50 MG/ML IJ SOLN
25.0000 mg | Freq: Once | INTRAMUSCULAR | Status: AC
  Administered 2023-12-09: 25 mg via INTRAVENOUS
  Filled 2023-12-09: qty 1

## 2023-12-09 MED ORDER — SODIUM CHLORIDE 0.9 % IV BOLUS
1000.0000 mL | Freq: Once | INTRAVENOUS | Status: AC
Start: 2023-12-09 — End: 2023-12-09
  Administered 2023-12-09: 1000 mL via INTRAVENOUS

## 2023-12-09 MED ORDER — PROCHLORPERAZINE EDISYLATE 10 MG/2ML IJ SOLN
10.0000 mg | Freq: Once | INTRAMUSCULAR | Status: AC
  Administered 2023-12-09: 10 mg via INTRAVENOUS
  Filled 2023-12-09: qty 2

## 2023-12-09 NOTE — ED Provider Triage Note (Signed)
 Emergency Medicine Provider Triage Evaluation Note  Kellie Schmidt , a 28 y.o. adult  was evaluated in triage.  Pt complains of suden severe H/A at 10am. Woke from sleep. Vision blurry and severe nausea. No H/o migraine. No H/O similar H/A. Vapes THC but no H/O frequent vomiting or hyperemesis. Now starting to get hot cold spells.  Review of Systems  Positive: Blurred vision Negative: fever  Physical Exam  BP (!) 123/102 (BP Location: Right Wrist)   Pulse 91   Temp 97.8 F (36.6 C) (Oral)   Resp 17   Ht 5\' 7"  (1.702 m)   Wt (!) 170.1 kg   LMP 11/14/2023   SpO2 100%   BMI 58.73 kg/m  Gen:   Nauseated and active vomiting Resp:  Normal effort. Hyperventilating MSK:   Moves extremities without difficulty  Other:  PERRL. No visual deficit  Medical Decision Making  Medically screening exam initiated at 1:50 PM.  Appropriate orders placed.  Kellie Schmidt was informed that the remainder of the evaluation will be completed by another provider, this initial triage assessment does not replace that evaluation, and the importance of remaining in the ED until their evaluation is complete.  No stroke symptoms.   Kellie Barrette, MD 12/09/23 [phone]

## 2023-12-09 NOTE — ED Notes (Signed)
 Pt requesting IV to be removed.  Pt sts she is feeling much better. Pt educated to return to ED if needed.

## 2023-12-09 NOTE — ED Triage Notes (Signed)
 Pt BIB EMS. Pt from home for n/v/d. Pt states she had a bad cucumber last night. Axox4.

## 2023-12-13 DIAGNOSIS — Z419 Encounter for procedure for purposes other than remedying health state, unspecified: Secondary | ICD-10-CM | POA: Diagnosis not present

## 2023-12-16 DIAGNOSIS — A64 Unspecified sexually transmitted disease: Secondary | ICD-10-CM | POA: Diagnosis not present

## 2023-12-16 DIAGNOSIS — Z113 Encounter for screening for infections with a predominantly sexual mode of transmission: Secondary | ICD-10-CM | POA: Diagnosis not present

## 2023-12-18 DIAGNOSIS — F329 Major depressive disorder, single episode, unspecified: Secondary | ICD-10-CM | POA: Diagnosis not present

## 2024-01-13 DIAGNOSIS — G5603 Carpal tunnel syndrome, bilateral upper limbs: Secondary | ICD-10-CM | POA: Diagnosis not present

## 2024-01-15 DIAGNOSIS — F331 Major depressive disorder, recurrent, moderate: Secondary | ICD-10-CM | POA: Diagnosis not present

## 2024-01-15 DIAGNOSIS — F900 Attention-deficit hyperactivity disorder, predominantly inattentive type: Secondary | ICD-10-CM | POA: Diagnosis not present

## 2024-01-15 DIAGNOSIS — F411 Generalized anxiety disorder: Secondary | ICD-10-CM | POA: Diagnosis not present

## 2024-01-22 DIAGNOSIS — G5601 Carpal tunnel syndrome, right upper limb: Secondary | ICD-10-CM | POA: Diagnosis not present

## 2024-01-22 DIAGNOSIS — G5602 Carpal tunnel syndrome, left upper limb: Secondary | ICD-10-CM | POA: Diagnosis not present

## 2024-01-22 DIAGNOSIS — G5603 Carpal tunnel syndrome, bilateral upper limbs: Secondary | ICD-10-CM | POA: Diagnosis not present

## 2024-01-24 DIAGNOSIS — Z419 Encounter for procedure for purposes other than remedying health state, unspecified: Secondary | ICD-10-CM | POA: Diagnosis not present

## 2024-02-12 DIAGNOSIS — F411 Generalized anxiety disorder: Secondary | ICD-10-CM | POA: Diagnosis not present

## 2024-02-12 DIAGNOSIS — F4310 Post-traumatic stress disorder, unspecified: Secondary | ICD-10-CM | POA: Diagnosis not present

## 2024-02-12 DIAGNOSIS — F900 Attention-deficit hyperactivity disorder, predominantly inattentive type: Secondary | ICD-10-CM | POA: Diagnosis not present

## 2024-02-12 DIAGNOSIS — F331 Major depressive disorder, recurrent, moderate: Secondary | ICD-10-CM | POA: Diagnosis not present

## 2024-02-23 DIAGNOSIS — Z419 Encounter for procedure for purposes other than remedying health state, unspecified: Secondary | ICD-10-CM | POA: Diagnosis not present

## 2024-02-24 DIAGNOSIS — F331 Major depressive disorder, recurrent, moderate: Secondary | ICD-10-CM | POA: Diagnosis not present

## 2024-02-24 DIAGNOSIS — F900 Attention-deficit hyperactivity disorder, predominantly inattentive type: Secondary | ICD-10-CM | POA: Diagnosis not present

## 2024-02-24 DIAGNOSIS — F411 Generalized anxiety disorder: Secondary | ICD-10-CM | POA: Diagnosis not present

## 2024-03-09 DIAGNOSIS — F900 Attention-deficit hyperactivity disorder, predominantly inattentive type: Secondary | ICD-10-CM | POA: Diagnosis not present

## 2024-03-09 DIAGNOSIS — F4310 Post-traumatic stress disorder, unspecified: Secondary | ICD-10-CM | POA: Diagnosis not present

## 2024-03-09 DIAGNOSIS — F331 Major depressive disorder, recurrent, moderate: Secondary | ICD-10-CM | POA: Diagnosis not present

## 2024-03-09 DIAGNOSIS — F411 Generalized anxiety disorder: Secondary | ICD-10-CM | POA: Diagnosis not present

## 2024-03-18 DIAGNOSIS — F331 Major depressive disorder, recurrent, moderate: Secondary | ICD-10-CM | POA: Diagnosis not present

## 2024-03-18 DIAGNOSIS — F411 Generalized anxiety disorder: Secondary | ICD-10-CM | POA: Diagnosis not present

## 2024-03-18 DIAGNOSIS — F4310 Post-traumatic stress disorder, unspecified: Secondary | ICD-10-CM | POA: Diagnosis not present

## 2024-03-18 DIAGNOSIS — F900 Attention-deficit hyperactivity disorder, predominantly inattentive type: Secondary | ICD-10-CM | POA: Diagnosis not present

## 2024-03-25 DIAGNOSIS — Z419 Encounter for procedure for purposes other than remedying health state, unspecified: Secondary | ICD-10-CM | POA: Diagnosis not present

## 2024-04-24 DIAGNOSIS — Z419 Encounter for procedure for purposes other than remedying health state, unspecified: Secondary | ICD-10-CM | POA: Diagnosis not present

## 2024-04-30 ENCOUNTER — Other Ambulatory Visit: Payer: Self-pay | Admitting: Medical Genetics

## 2024-05-17 DIAGNOSIS — F411 Generalized anxiety disorder: Secondary | ICD-10-CM | POA: Diagnosis not present

## 2024-05-17 DIAGNOSIS — F331 Major depressive disorder, recurrent, moderate: Secondary | ICD-10-CM | POA: Diagnosis not present

## 2024-05-17 DIAGNOSIS — F900 Attention-deficit hyperactivity disorder, predominantly inattentive type: Secondary | ICD-10-CM | POA: Diagnosis not present

## 2024-05-17 DIAGNOSIS — F4310 Post-traumatic stress disorder, unspecified: Secondary | ICD-10-CM | POA: Diagnosis not present

## 2024-05-25 DIAGNOSIS — F411 Generalized anxiety disorder: Secondary | ICD-10-CM | POA: Diagnosis not present

## 2024-05-25 DIAGNOSIS — Z419 Encounter for procedure for purposes other than remedying health state, unspecified: Secondary | ICD-10-CM | POA: Diagnosis not present

## 2024-05-25 DIAGNOSIS — F4310 Post-traumatic stress disorder, unspecified: Secondary | ICD-10-CM | POA: Diagnosis not present

## 2024-05-25 DIAGNOSIS — F900 Attention-deficit hyperactivity disorder, predominantly inattentive type: Secondary | ICD-10-CM | POA: Diagnosis not present

## 2024-05-25 DIAGNOSIS — F331 Major depressive disorder, recurrent, moderate: Secondary | ICD-10-CM | POA: Diagnosis not present

## 2024-06-04 DIAGNOSIS — F4310 Post-traumatic stress disorder, unspecified: Secondary | ICD-10-CM | POA: Diagnosis not present

## 2024-06-04 DIAGNOSIS — F900 Attention-deficit hyperactivity disorder, predominantly inattentive type: Secondary | ICD-10-CM | POA: Diagnosis not present

## 2024-06-04 DIAGNOSIS — F331 Major depressive disorder, recurrent, moderate: Secondary | ICD-10-CM | POA: Diagnosis not present

## 2024-06-04 DIAGNOSIS — F411 Generalized anxiety disorder: Secondary | ICD-10-CM | POA: Diagnosis not present

## 2024-06-10 DIAGNOSIS — F900 Attention-deficit hyperactivity disorder, predominantly inattentive type: Secondary | ICD-10-CM | POA: Diagnosis not present

## 2024-06-10 DIAGNOSIS — F331 Major depressive disorder, recurrent, moderate: Secondary | ICD-10-CM | POA: Diagnosis not present

## 2024-06-10 DIAGNOSIS — F411 Generalized anxiety disorder: Secondary | ICD-10-CM | POA: Diagnosis not present

## 2024-06-10 DIAGNOSIS — F4310 Post-traumatic stress disorder, unspecified: Secondary | ICD-10-CM | POA: Diagnosis not present

## 2024-06-15 DIAGNOSIS — F411 Generalized anxiety disorder: Secondary | ICD-10-CM | POA: Diagnosis not present

## 2024-06-15 DIAGNOSIS — F331 Major depressive disorder, recurrent, moderate: Secondary | ICD-10-CM | POA: Diagnosis not present

## 2024-06-15 DIAGNOSIS — F900 Attention-deficit hyperactivity disorder, predominantly inattentive type: Secondary | ICD-10-CM | POA: Diagnosis not present

## 2024-06-15 DIAGNOSIS — F4310 Post-traumatic stress disorder, unspecified: Secondary | ICD-10-CM | POA: Diagnosis not present

## 2024-06-17 DIAGNOSIS — S9032XA Contusion of left foot, initial encounter: Secondary | ICD-10-CM | POA: Diagnosis not present

## 2024-06-17 DIAGNOSIS — M79672 Pain in left foot: Secondary | ICD-10-CM | POA: Diagnosis not present

## 2024-06-17 DIAGNOSIS — X58XXXA Exposure to other specified factors, initial encounter: Secondary | ICD-10-CM | POA: Diagnosis not present

## 2024-06-17 DIAGNOSIS — S90122A Contusion of left lesser toe(s) without damage to nail, initial encounter: Secondary | ICD-10-CM | POA: Diagnosis not present

## 2024-06-18 DIAGNOSIS — F331 Major depressive disorder, recurrent, moderate: Secondary | ICD-10-CM | POA: Diagnosis not present

## 2024-06-18 DIAGNOSIS — F4310 Post-traumatic stress disorder, unspecified: Secondary | ICD-10-CM | POA: Diagnosis not present

## 2024-06-18 DIAGNOSIS — F411 Generalized anxiety disorder: Secondary | ICD-10-CM | POA: Diagnosis not present

## 2024-06-18 DIAGNOSIS — F900 Attention-deficit hyperactivity disorder, predominantly inattentive type: Secondary | ICD-10-CM | POA: Diagnosis not present

## 2024-06-23 DIAGNOSIS — N611 Abscess of the breast and nipple: Secondary | ICD-10-CM | POA: Diagnosis not present

## 2024-06-25 DIAGNOSIS — Z419 Encounter for procedure for purposes other than remedying health state, unspecified: Secondary | ICD-10-CM | POA: Diagnosis not present

## 2024-07-02 DIAGNOSIS — F900 Attention-deficit hyperactivity disorder, predominantly inattentive type: Secondary | ICD-10-CM | POA: Diagnosis not present

## 2024-07-02 DIAGNOSIS — F411 Generalized anxiety disorder: Secondary | ICD-10-CM | POA: Diagnosis not present

## 2024-07-02 DIAGNOSIS — F4310 Post-traumatic stress disorder, unspecified: Secondary | ICD-10-CM | POA: Diagnosis not present

## 2024-07-02 DIAGNOSIS — F331 Major depressive disorder, recurrent, moderate: Secondary | ICD-10-CM | POA: Diagnosis not present

## 2024-07-05 ENCOUNTER — Encounter: Admitting: Obstetrics and Gynecology

## 2024-07-22 ENCOUNTER — Emergency Department (HOSPITAL_BASED_OUTPATIENT_CLINIC_OR_DEPARTMENT_OTHER)
Admission: EM | Admit: 2024-07-22 | Discharge: 2024-07-22 | Disposition: A | Discharge status: Home / Self Care | Attending: Emergency Medicine | Admitting: Emergency Medicine

## 2024-07-22 ENCOUNTER — Emergency Department (HOSPITAL_BASED_OUTPATIENT_CLINIC_OR_DEPARTMENT_OTHER)

## 2024-07-22 ENCOUNTER — Other Ambulatory Visit: Payer: Self-pay

## 2024-07-22 ENCOUNTER — Encounter (HOSPITAL_BASED_OUTPATIENT_CLINIC_OR_DEPARTMENT_OTHER): Payer: Self-pay

## 2024-07-22 DIAGNOSIS — J3489 Other specified disorders of nose and nasal sinuses: Secondary | ICD-10-CM | POA: Insufficient documentation

## 2024-07-22 DIAGNOSIS — J069 Acute upper respiratory infection, unspecified: Secondary | ICD-10-CM | POA: Diagnosis not present

## 2024-07-22 DIAGNOSIS — R0981 Nasal congestion: Secondary | ICD-10-CM | POA: Insufficient documentation

## 2024-07-22 DIAGNOSIS — B9789 Other viral agents as the cause of diseases classified elsewhere: Secondary | ICD-10-CM | POA: Diagnosis not present

## 2024-07-22 DIAGNOSIS — R059 Cough, unspecified: Secondary | ICD-10-CM | POA: Diagnosis not present

## 2024-07-22 DIAGNOSIS — R6883 Chills (without fever): Secondary | ICD-10-CM | POA: Diagnosis not present

## 2024-07-22 DIAGNOSIS — R519 Headache, unspecified: Secondary | ICD-10-CM | POA: Insufficient documentation

## 2024-07-22 DIAGNOSIS — Z8659 Personal history of other mental and behavioral disorders: Secondary | ICD-10-CM

## 2024-07-22 LAB — RESP PANEL BY RT-PCR (RSV, FLU A&B, COVID)  RVPGX2
Influenza A by PCR: NEGATIVE
Influenza B by PCR: NEGATIVE
Resp Syncytial Virus by PCR: NEGATIVE
SARS Coronavirus 2 by RT PCR: NEGATIVE

## 2024-07-22 MED ORDER — DEXAMETHASONE 4 MG PO TABS
10.0000 mg | ORAL_TABLET | Freq: Once | ORAL | Status: AC
  Administered 2024-07-22: 10 mg via ORAL
  Filled 2024-07-22: qty 3

## 2024-07-22 MED ORDER — IBUPROFEN 800 MG PO TABS
800.0000 mg | ORAL_TABLET | Freq: Once | ORAL | Status: AC
  Administered 2024-07-22: 800 mg via ORAL
  Filled 2024-07-22: qty 1

## 2024-07-22 NOTE — ED Provider Notes (Signed)
 [place] EMERGENCY DEPARTMENT AT [facility] Provider Note   CSN: [id number] Arrival date & time: 07/22/24  1313     Patient presents with: Headache and Chills   Kellie Schmidt is a 28 y.o. adult.   Kellie Schmidt is a 28 yo presenting with headache and sinus symptoms. On Tuesday, ~2 days ago, the patient started experiencing facial pressure and fullness along with headache. Also reports hearing popping in her ears and fullness there as well. Patient endorses chills, but has not checked own temperature. Patient's mother is having similar symptoms and she came today with the patient.  Notably, her church congregation has recently been having similar symptoms as well. Patient has been eating and drinking normally. Patient denies nausea, vomiting, diarrhea.  No dyspnea, no chest pain. Denies recent travel. Patient has a history of sinus infections, bronchitis, and ear infections in the past.   Headache Associated symptoms: congestion, ear pain and sinus pressure   Associated symptoms: no abdominal pain, no back pain, no cough, no dizziness, no eye pain, no fever, no seizures, no sore throat and no vomiting       Prior to Admission medications   Medication Sig Start Date End Date Taking? Authorizing Provider  albuterol  (PROVENTIL ) (2.5 MG/3ML) 0.083% nebulizer solution Take 3 mLs (2.5 mg total) by nebulization every 6 (six) hours as needed for wheezing or shortness of breath. 08/15/22   Menshew, Candida LULLA Kings, PA-C  albuterol  (VENTOLIN  HFA) 108 (90 Base) MCG/ACT inhaler Inhale 2 puffs into the lungs every 6 (six) hours as needed for wheezing or shortness of breath. 08/15/22   Menshew, Candida LULLA Kings, PA-C  azithromycin  (ZITHROMAX  Z-PAK) 250 MG tablet Take 2 tablets (500 mg) on  Day 1,  followed by 1 tablet (250 mg) once daily on Days 2 through 5. 08/15/22   Menshew, Candida LULLA Kings, PA-C  benzonatate  (TESSALON  PERLES) 100 MG capsule Take 1-2 tabs TID prn cough 08/15/22    Menshew, Jenise V Bacon, PA-C  benzonatate  (TESSALON ) 100 MG capsule Take 1 capsule (100 mg total) by mouth every 8 (eight) hours. 10/28/23   Claudene Lenis, NP  buPROPion  (WELLBUTRIN  XL) 150 MG 24 hr tablet TAKE 1 TABLET BY MOUTH EVERY DAY IN THE MORNING Patient not taking: Reported on 06/11/2022 02/18/22   Georgina Speaks, FNP  HYDROcodone -acetaminophen  (NORCO) 5-325 MG tablet Take 1 tablet by mouth every 6 (six) hours as needed for severe pain. 02/07/23   Bradler, Evan K, MD  ondansetron  (ZOFRAN -ODT) 8 MG disintegrating tablet Take 1 tablet (8 mg total) by mouth every 8 (eight) hours as needed for nausea or vomiting. 02/07/23   Bradler, Evan K, MD    Allergies: Banana and Penicillins    Review of Systems  Constitutional:  Negative for chills and fever.  HENT:  Positive for congestion, ear pain, rhinorrhea, sinus pressure and sinus pain. Negative for ear discharge, sore throat and trouble swallowing.   Eyes:  Negative for pain and visual disturbance.  Respiratory:  Negative for cough and shortness of breath.   Cardiovascular:  Negative for chest pain and palpitations.  Gastrointestinal:  Negative for abdominal pain and vomiting.  Genitourinary:  Negative for dysuria and hematuria.  Musculoskeletal:  Negative for arthralgias and back pain.  Skin:  Negative for color change and rash.  Neurological:  Positive for headaches. Negative for dizziness, seizures, syncope and light-headedness.  All other systems reviewed and are negative.   Updated Vital Signs BP (!) 146/90 (BP Location: Right Arm)  Pulse (!) 103   Temp 98.9 F (37.2 C) (Oral)   Resp 20   Ht 5' 7 (1.702 m)   Wt (!) 170.1 kg   SpO2 99%   BMI 58.73 kg/m   Physical Exam Vitals and nursing note reviewed.  Constitutional:      General: She is not in acute distress.    Appearance: She is well-developed. She is obese.  HENT:     Head: Normocephalic and atraumatic.     Right Ear: Hearing, tympanic membrane, ear canal and external  ear normal.     Left Ear: Hearing, tympanic membrane, ear canal and external ear normal.     Nose: Congestion and rhinorrhea present.     Mouth/Throat:     Mouth: Mucous membranes are moist.     Pharynx: Oropharynx is clear.  Eyes:     Conjunctiva/sclera: Conjunctivae normal.     Pupils: Pupils are equal, round, and reactive to light.  Cardiovascular:     Rate and Rhythm: Normal rate and regular rhythm.     Heart sounds: No murmur heard. Pulmonary:     Effort: Pulmonary effort is normal. No respiratory distress.     Breath sounds: Normal breath sounds.  Abdominal:     Palpations: Abdomen is soft.     Tenderness: There is no abdominal tenderness.  Musculoskeletal:        General: No swelling.     Cervical back: Normal range of motion and neck supple.  Skin:    General: Skin is warm and dry.     Capillary Refill: Capillary refill takes less than 2 seconds.  Neurological:     General: No focal deficit present.     Mental Status: She is alert.     Cranial Nerves: No cranial nerve deficit.     Sensory: No sensory deficit.     Motor: No weakness.  Psychiatric:        Mood and Affect: Mood normal.     (all labs ordered are listed, but only abnormal results are displayed) Labs Reviewed  RESP PANEL BY RT-PCR (RSV, FLU A&B, COVID)  RVPGX2    EKG: None  Radiology: No results found.   Procedures   Medications Ordered in the ED - No data to display                                  Medical Decision Making The patient is a 28 year old presenting with her mother for headache and sinus symptoms.  Patient is well-hydrated on exam with no evidence of respiratory distress and no systemic infection signs or symptoms.  Reassuringly, patient is afebrile and not tachycardic.  Suspect viral URI, likely sinusitis.  Patient received Decadron  x 1 and ibuprofen  for symptomatic treatment.  Quad RPP and CXR pending at time of my departure from the ED, care of Dr. Ruthe.  Likely  discharge home with supportive care if CXR is unremarkable.  Amount and/or Complexity of Data Reviewed Labs: ordered. Decision-making details documented in ED Course. Radiology: ordered and independent interpretation performed. Decision-making details documented in ED Course.  Risk Prescription drug management.      Final diagnoses:  None    ED Discharge Orders     None        Abem Shaddix, MD 07/22/24 1421    Ruthe Cornet, DO 07/22/24 1501

## 2024-07-22 NOTE — ED Notes (Signed)
 Reviewed AVS/discharge instruction with patient. Time allotted for and all questions answered. Patient is agreeable for d/c and escorted to ed exit by staff.

## 2024-07-22 NOTE — Discharge Instructions (Signed)
 Overall I suspect that you have COVID but test was negative today.  But given sick contact with COVID likely suspect you have this as well.  Recommend Tylenol  and ibuprofen .  1000 mg of Tylenol  every 6 hours as needed for pain.  400 mg ibuprofen  every 8 hours as needed for pain.  I have put in a referral for neurology for you to have your tics reevaluated as we discussed

## 2024-07-22 NOTE — ED Triage Notes (Signed)
 Arrives ambulatory to the ED with complaints of headache, body tingling, and generalized body pain all over x2 days. Patient also reports increased hunger. Sick exposure in her home.

## 2024-07-23 ENCOUNTER — Other Ambulatory Visit: Payer: Self-pay | Admitting: Medical Genetics

## 2024-07-23 DIAGNOSIS — Z006 Encounter for examination for normal comparison and control in clinical research program: Secondary | ICD-10-CM

## 2024-08-30 DIAGNOSIS — F331 Major depressive disorder, recurrent, moderate: Secondary | ICD-10-CM | POA: Diagnosis not present

## 2024-08-30 DIAGNOSIS — F4310 Post-traumatic stress disorder, unspecified: Secondary | ICD-10-CM | POA: Diagnosis not present

## 2024-08-30 DIAGNOSIS — F900 Attention-deficit hyperactivity disorder, predominantly inattentive type: Secondary | ICD-10-CM | POA: Diagnosis not present

## 2024-08-30 DIAGNOSIS — F411 Generalized anxiety disorder: Secondary | ICD-10-CM | POA: Diagnosis not present

## 2024-09-01 ENCOUNTER — Encounter: Admitting: Obstetrics and Gynecology

## 2024-09-15 ENCOUNTER — Ambulatory Visit (HOSPITAL_COMMUNITY)
Admission: RE | Admit: 2024-09-15 | Discharge: 2024-09-15 | Disposition: A | Discharge status: Home / Self Care | Source: Ambulatory Visit | Attending: Family Medicine | Admitting: Family Medicine

## 2024-09-15 ENCOUNTER — Encounter (HOSPITAL_COMMUNITY): Payer: Self-pay

## 2024-09-15 VITALS — BP 139/91 | HR 92 | Temp 98.9°F | Resp 18

## 2024-09-15 DIAGNOSIS — J4521 Mild intermittent asthma with (acute) exacerbation: Secondary | ICD-10-CM

## 2024-09-15 DIAGNOSIS — J069 Acute upper respiratory infection, unspecified: Secondary | ICD-10-CM | POA: Diagnosis not present

## 2024-09-15 DIAGNOSIS — J029 Acute pharyngitis, unspecified: Secondary | ICD-10-CM | POA: Diagnosis not present

## 2024-09-15 HISTORY — DX: Allergy, unspecified, initial encounter: T78.40XA

## 2024-09-15 HISTORY — DX: Anxiety disorder, unspecified: F41.9

## 2024-09-15 HISTORY — DX: Unspecified asthma, uncomplicated: J45.909

## 2024-09-15 MED ORDER — HYDROCODONE BIT-HOMATROP MBR 5-1.5 MG/5ML PO SOLN: 5.0000 mL | Freq: Four times a day (QID) | ORAL | 0 refills | Status: AC | PRN

## 2024-09-15 MED ORDER — PREDNISONE 20 MG PO TABS: 40.0000 mg | ORAL_TABLET | Freq: Every day | ORAL | 0 refills | Status: AC

## 2024-09-15 NOTE — Discharge Instructions (Addendum)
 Be aware, your cough medication may cause drowsiness. Please do not drive, operate heavy machinery or make important decisions while on this medication, it can cloud your judgement.  You may also use over the counter AFRIN nasal spray for nasal congestion. This medication is for use in the nose. Take it as directed on the label. Shake well before using. Do not use it more often than directed. Do not use for more than 3 days in a row without talking to your care team first. Make sure that you are using your nasal spray correctly.

## 2024-09-15 NOTE — ED Provider Notes (Signed)
 [facility]   [id number] 09/15/24 Arrival Time: 1513  ASSESSMENT & PLAN:  1. Viral URI with cough   2. Sore throat   3. Mild intermittent asthma with acute exacerbation     Discussed typical duration of likely viral illness/common cold.  OTC symptom care as needed.  Meds ordered this encounter  Medications   HYDROcodone  bit-homatropine (HYCODAN) 5-1.5 MG/5ML syrup    Sig: Take 5 mLs by mouth every 6 (six) hours as needed for cough.    Dispense:  60 mL    Refill:  0   predniSONE  (DELTASONE ) 20 MG tablet    Sig: Take 2 tablets (40 mg total) by mouth daily.    Dispense:  10 tablet    Refill:  0   School note provided.   Follow-up Information     Anice Wilbert BIRCH, NP.   Specialty: Family Medicine Why: As needed. Contact information: [address] [place] [address] [phone]         [facility].   Specialty: Urgent Care Why: If worsening or failing to improve as anticipated. Contact information: [address] [place]  [address] [phone]                Reviewed expectations re: course of current medical issues. Questions answered. Outlined signs and symptoms indicating need for more acute intervention. Understanding verbalized. After Visit Summary given.   SUBJECTIVE: History from: Patient. Kellie Schmidt is a 28 y.o. adult. Pt states she started with nausea on Saturday which resolved. She states not she has sore throat, weakness and congestion. She took one tylenol  to help with jaw pain which helped.  Denies: fever. Normal PO intake without n/v/d.  OBJECTIVE:  Vitals:   09/15/24 1525  BP: (!) 139/91  Pulse: 92  Resp: 18  Temp: 98.9 F (37.2 C)  TempSrc: Oral  SpO2: 96%    General appearance: alert; no distress Eyes: PERRLA; EOMI; conjunctiva normal HENT: [place]; AT; with nasal congestion; throat cobblestoning Neck: supple  Lungs: speaks full sentences without  difficulty; unlabored; mild bilat wheezing Extremities: no edema Skin: warm and dry Neurologic: normal gait Psychological: alert and cooperative; normal mood and affect  Labs:  Labs Reviewed - No data to display  Imaging: No results found.  Allergies  Allergen Reactions   Banana Itching   Penicillins Hives    Past Medical History:  Diagnosis Date   Allergy    Anemia    Anxiety    Asthma    Borderline diabetic    Depression    Hyperlipemia    Morbid obesity (HCC)    Wheezing    Social History   Socioeconomic History   Marital status: Single    Spouse name: Not on file   Number of children: Not on file   Years of education: Not on file   Highest education level: Not on file  Occupational History   Not on file  Tobacco Use   Smoking status: Every Day    Types: Cigars, Cigarettes   Smokeless tobacco: Never   Tobacco comments:    She smokes black and milds - 2 per day  Vaping Use   Vaping status: Never Used  Substance and Sexual Activity   Alcohol use: No    Comment: last use 03/2022   Drug use: Yes    Types: Marijuana    Comment: every day mj use   Sexual activity: Not Currently    Birth  control/protection: None  Other Topics Concern   Not on file  Social History Narrative   Not on file   Social Drivers of Health   Financial Resource Strain: Not on file  Food Insecurity: Not on file  Transportation Needs: Not on file  Physical Activity: Not on file  Stress: Not on file  Social Connections: Unknown (02/25/2022)   Received from [facility]   Social Network    Social Network: Not on file  Intimate Partner Violence: Unknown (01/17/2022)   Received from [facility]   HITS    Physically Hurt: Not on file    Insult or Talk Down To: Not on file    Threaten Physical Harm: Not on file    Scream or Curse: Not on file   Family History  Problem Relation Age of Onset   Diabetes Mother    Diabetes Father    Asthma Father    Hyperlipidemia Maternal  Grandmother    Hypertension Maternal Grandmother    Diabetes Maternal Grandfather    Cancer Maternal Grandfather    Past Surgical History:  Procedure Laterality Date   TIBIA FRACTURE SURGERY     as a child   TONSILLECTOMY       Rolinda Rogue, MD 09/15/24 1752

## 2024-09-15 NOTE — ED Triage Notes (Signed)
 Pt states she started with nausea on Saturday which resolved. She states not she has sore throat, weakness and congestion. She took one tylenol  to help with jaw pain which helped.

## 2024-09-19 ENCOUNTER — Other Ambulatory Visit: Payer: Self-pay

## 2024-09-19 ENCOUNTER — Emergency Department (HOSPITAL_COMMUNITY)

## 2024-09-19 ENCOUNTER — Emergency Department (HOSPITAL_COMMUNITY)
Admission: EM | Admit: 2024-09-19 | Discharge: 2024-09-19 | Disposition: A | Discharge status: Home / Self Care | Attending: Emergency Medicine | Admitting: Emergency Medicine

## 2024-09-19 DIAGNOSIS — J069 Acute upper respiratory infection, unspecified: Secondary | ICD-10-CM | POA: Diagnosis not present

## 2024-09-19 DIAGNOSIS — J45909 Unspecified asthma, uncomplicated: Secondary | ICD-10-CM | POA: Diagnosis not present

## 2024-09-19 DIAGNOSIS — R509 Fever, unspecified: Secondary | ICD-10-CM | POA: Diagnosis not present

## 2024-09-19 DIAGNOSIS — R079 Chest pain, unspecified: Secondary | ICD-10-CM | POA: Diagnosis not present

## 2024-09-19 DIAGNOSIS — Z743 Need for continuous supervision: Secondary | ICD-10-CM | POA: Diagnosis not present

## 2024-09-19 DIAGNOSIS — F17210 Nicotine dependence, cigarettes, uncomplicated: Secondary | ICD-10-CM | POA: Diagnosis not present

## 2024-09-19 DIAGNOSIS — R0789 Other chest pain: Secondary | ICD-10-CM | POA: Diagnosis not present

## 2024-09-19 DIAGNOSIS — B9789 Other viral agents as the cause of diseases classified elsewhere: Secondary | ICD-10-CM | POA: Diagnosis not present

## 2024-09-19 LAB — RESP PANEL BY RT-PCR (RSV, FLU A&B, COVID)  RVPGX2
Influenza A by PCR: NEGATIVE
Influenza B by PCR: NEGATIVE
Resp Syncytial Virus by PCR: NEGATIVE
SARS Coronavirus 2 by RT PCR: NEGATIVE

## 2024-09-19 MED ORDER — LIDOCAINE 5 % EX PTCH: 1.0000 | MEDICATED_PATCH | CUTANEOUS | 0 refills | Status: AC

## 2024-09-19 MED ORDER — IBUPROFEN 600 MG PO TABS: 600.0000 mg | ORAL_TABLET | Freq: Four times a day (QID) | ORAL | 0 refills | Status: AC | PRN

## 2024-09-19 MED ORDER — KETOROLAC TROMETHAMINE 15 MG/ML IJ SOLN
15.0000 mg | Freq: Once | INTRAMUSCULAR | Status: AC
  Administered 2024-09-19: 15 mg via INTRAMUSCULAR
  Filled 2024-09-19: qty 1

## 2024-09-19 MED ORDER — ACETAMINOPHEN 500 MG PO TABS
1000.0000 mg | ORAL_TABLET | Freq: Once | ORAL | Status: AC
  Administered 2024-09-19: 1000 mg via ORAL
  Filled 2024-09-19: qty 2

## 2024-09-19 NOTE — ED Notes (Signed)
 Patient Alert and oriented to baseline. Stable and ambulatory to baseline. Patient verbalized understanding of the discharge instructions.  Patient belongings were taken by the patient.

## 2024-09-19 NOTE — ED Provider Notes (Signed)
 [place] EMERGENCY DEPARTMENT AT [facility] Provider Note  CSN: [id number] Arrival date & time: 09/19/24 1128  Chief Complaint(s) URI and Cough  HPI Kellie Schmidt is a 28 y.o. adult history of obesity, asthma presenting to the emergency department with cough.  Patient reports associated chest pain with coughing, not pleuritic.  She reports body aches as well.  No fevers.  She reports some sore throat and congestion.  She feels somewhat short of breath at times.  She also reports generalized weakness.  No abdominal pain.  No urinary symptoms.   Past Medical History Past Medical History:  Diagnosis Date   Allergy    Anemia    Anxiety    Asthma    Borderline diabetic    Depression    Hyperlipemia    Morbid obesity (HCC)    Wheezing    There are no active problems to display for this patient.  Home Medication(s) Prior to Admission medications   Medication Sig Start Date End Date Taking? Authorizing Provider  ibuprofen  (ADVIL ) 600 MG tablet Take 1 tablet (600 mg total) by mouth every 6 (six) hours as needed. 09/19/24  Yes Francesca Elsie CROME, MD  lidocaine  (LIDODERM ) 5 % Place 1 patch onto the skin daily. Remove & Discard patch within 12 hours or as directed by MD 09/19/24  Yes Francesca Elsie CROME, MD  albuterol  (PROVENTIL ) (2.5 MG/3ML) 0.083% nebulizer solution Take 3 mLs (2.5 mg total) by nebulization every 6 (six) hours as needed for wheezing or shortness of breath. 08/15/22   Menshew, Candida LULLA Kings, PA-C  albuterol  (VENTOLIN  HFA) 108 (90 Base) MCG/ACT inhaler Inhale 2 puffs into the lungs every 6 (six) hours as needed for wheezing or shortness of breath. 08/15/22   Menshew, Candida LULLA Kings, PA-C  buPROPion  (WELLBUTRIN  XL) 150 MG 24 hr tablet TAKE 1 TABLET BY MOUTH EVERY DAY IN THE MORNING Patient not taking: Reported on 06/11/2022 02/18/22   Georgina Speaks, FNP  HYDROcodone  bit-homatropine (HYCODAN) 5-1.5 MG/5ML syrup Take 5 mLs by mouth every 6 (six) hours as needed for  cough. 09/15/24   Rolinda Rogue, MD  predniSONE  (DELTASONE ) 20 MG tablet Take 2 tablets (40 mg total) by mouth daily. 09/15/24   Rolinda Rogue, MD  sertraline (ZOLOFT) 25 MG tablet Take 25 mg by mouth daily.    [provider]  traZODone (DESYREL) 50 MG tablet Take 50 mg by mouth at bedtime. 08/30/24   [provider]                                                                                                                                    Past Surgical History Past Surgical History:  Procedure Laterality Date   TIBIA FRACTURE SURGERY     as a child   TONSILLECTOMY     Family History Family History  Problem Relation Age of Onset   Diabetes Mother    Diabetes  Father    Asthma Father    Hyperlipidemia Maternal Grandmother    Hypertension Maternal Grandmother    Diabetes Maternal Grandfather    Cancer Maternal Grandfather     Social History Social History   Tobacco Use   Smoking status: Every Day    Types: Cigars, Cigarettes   Smokeless tobacco: Never   Tobacco comments:    She smokes black and milds - 2 per day  Vaping Use   Vaping status: Never Used  Substance Use Topics   Alcohol use: No    Comment: last use 03/2022   Drug use: Yes    Types: Marijuana    Comment: every day mj use   Allergies Banana and Penicillins  Review of Systems Review of Systems  All other systems reviewed and are negative.   Physical Exam Vital Signs  I have reviewed the triage vital signs BP 125/89   Pulse 91   Temp 98.6 F (37 C)   Resp 10   LMP 09/07/2024 (Approximate)   SpO2 100%  Physical Exam Vitals and nursing note reviewed.  Constitutional:      General: She is not in acute distress.    Appearance: Normal appearance. She is obese.  HENT:     Mouth/Throat:     Mouth: Mucous membranes are moist.  Eyes:     Conjunctiva/sclera: Conjunctivae normal.  Cardiovascular:     Rate and Rhythm: Normal rate and regular rhythm.  Pulmonary:     Effort:  Pulmonary effort is normal. No respiratory distress.     Breath sounds: Normal breath sounds.  Abdominal:     General: Abdomen is flat.     Palpations: Abdomen is soft.     Tenderness: There is no abdominal tenderness.  Musculoskeletal:     Right lower leg: No edema.     Left lower leg: No edema.     Comments: Parasternal chest wall tenderness which reproduces patient's pain  Skin:    General: Skin is warm and dry.     Capillary Refill: Capillary refill takes less than 2 seconds.  Neurological:     Mental Status: She is alert and oriented to person, place, and time. Mental status is at baseline.  Psychiatric:        Mood and Affect: Mood normal.        Behavior: Behavior normal.     ED Results and Treatments Labs (all labs ordered are listed, but only abnormal results are displayed) Labs Reviewed  RESP PANEL BY RT-PCR (RSV, FLU A&B, COVID)  RVPGX2                                                                                                                          Radiology No results found.   Pertinent labs & imaging results that were available during my care of the patient were reviewed by me and considered in my medical decision making (see MDM for details).  Medications Ordered in ED Medications  ketorolac  (TORADOL ) 15 MG/ML injection 15 mg (15 mg Intramuscular Given 09/19/24 1219)  acetaminophen  (TYLENOL ) tablet 1,000 mg (1,000 mg Oral Given 09/19/24 1220)                                                                                                                                     Procedures Procedures  (including critical care time)  Medical Decision Making / ED Course   MDM:  28 year old presenting to the emergency department with cough, URI symptoms.  Patient is overall well-appearing, symptoms seem most consistent with acute viral infection.  She does have some chest pain however this seems very musculoskeletal in nature.  Her x-ray is clear.  She  received Tylenol  and Toradol  with improvement in symptoms.  She reports that she has not been taking anything at home for pain, only steroids prescribed at an urgent care and Mucinex. ECG is reassuring. Low concern for any dangerous cause of chest pain e.g. ACS or PE. She is PERC negative. No PTX or PNA on CXR. Will discharge patient to home. All questions answered. Patient comfortable with plan of discharge. Return precautions discussed with patient and specified on the after visit summary.       Additional history obtained: -Additional history obtained from family -External records from outside source obtained and reviewed including: Chart review including previous notes, labs, imaging, consultation notes including prior notes    Lab Tests: -I ordered, reviewed, and interpreted labs.   The pertinent results include:   Labs Reviewed  RESP PANEL BY RT-PCR (RSV, FLU A&B, COVID)  RVPGX2    Notable for negative COVID  EKG   EKG Interpretation Date/Time:  Sunday September 19 2024 13:29:15 EST Ventricular Rate:  88 PR Interval:  122 QRS Duration:  91 QT Interval:  363 QTC Calculation: 440 R Axis:   33  Text Interpretation: Sinus rhythm Baseline wander in lead(s) V1 Confirmed by Francesca Fallow ([id number]) on 09/20/2024 4:04:12 PM         Imaging Studies ordered: I ordered imaging studies including CXR On my interpretation imaging demonstrates no acute process I independently visualized and interpreted imaging. I agree with the radiologist interpretation   Medicines ordered and prescription drug management: Meds ordered this encounter  Medications   ketorolac  (TORADOL ) 15 MG/ML injection 15 mg   acetaminophen  (TYLENOL ) tablet 1,000 mg   lidocaine  (LIDODERM ) 5 %    Sig: Place 1 patch onto the skin daily. Remove & Discard patch within 12 hours or as directed by MD    Dispense:  30 patch    Refill:  0   ibuprofen  (ADVIL ) 600 MG tablet    Sig: Take 1 tablet (600 mg total) by  mouth every 6 (six) hours as needed.    Dispense:  30 tablet    Refill:  0    -I have reviewed the patients home medicines and have made adjustments as needed  Social  Determinants of Health:  Diagnosis or treatment significantly limited by social determinants of health: obesity   Reevaluation: After the interventions noted above, I reevaluated the patient and found that their symptoms have improved  Co morbidities that complicate the patient evaluation  Past Medical History:  Diagnosis Date   Allergy    Anemia    Anxiety    Asthma    Borderline diabetic    Depression    Hyperlipemia    Morbid obesity (HCC)    Wheezing       Dispostion: Disposition decision including need for hospitalization was considered, and patient discharged from emergency department.    Final Clinical Impression(s) / ED Diagnoses Final diagnoses:  Viral URI with cough     This chart was dictated using voice recognition software.  Despite best efforts to proofread,  errors can occur which can change the documentation meaning.    Francesca Elsie CROME, MD 09/20/24 [phone]

## 2024-09-19 NOTE — ED Triage Notes (Signed)
 Pt here via ems with complaints of being dx with URI last week and given prednisone . Pt finished prednisone  today, however has not gotten better. Pt endorses painful cough. VSS with ems.

## 2024-09-19 NOTE — Discharge Instructions (Addendum)
 We evaluated you for your cough and chest pain.  Your testing in the emergency department is reassuring including her chest x-ray.  Your COVID and flu test were negative.  Please take Tylenol  (acetaminophen ) and Motrin  (ibuprofen ) for your symptoms at home.  You can take 1000 mg of Tylenol  every 6 hours and 600 mg of Motrin  every 6 hours as needed for your symptoms.  You can take these medicines together as needed, either at the same time, or alternating every 3 hours.  Please make sure that you are drinking lots of fluids and getting plenty of rest.  If you have any worsening symptoms such as worsening pain, difficulty breathing, high fevers, increasing weakness, or any other new symptoms, please return to the emergency department.

## 2024-09-27 DIAGNOSIS — F4310 Post-traumatic stress disorder, unspecified: Secondary | ICD-10-CM | POA: Diagnosis not present

## 2024-09-27 DIAGNOSIS — F900 Attention-deficit hyperactivity disorder, predominantly inattentive type: Secondary | ICD-10-CM | POA: Diagnosis not present

## 2024-10-07 ENCOUNTER — Emergency Department (HOSPITAL_COMMUNITY)
Admission: EM | Admit: 2024-10-07 | Discharge: 2024-10-07 | Discharge status: Against Medical Advice | Attending: Emergency Medicine | Admitting: Emergency Medicine

## 2024-10-07 DIAGNOSIS — Z5321 Procedure and treatment not carried out due to patient leaving prior to being seen by health care provider: Secondary | ICD-10-CM | POA: Diagnosis not present

## 2024-10-07 DIAGNOSIS — R509 Fever, unspecified: Secondary | ICD-10-CM | POA: Diagnosis not present

## 2024-10-07 DIAGNOSIS — R059 Cough, unspecified: Secondary | ICD-10-CM | POA: Diagnosis not present

## 2024-10-07 NOTE — ED Notes (Signed)
 Patient wanted to leave and about fell walking out from the blue zone to the door. I had to catch her before she hit the floor. Patient stated that she has been here to long and wanted to leave. She told me to roll her outside so she could go. Patient was was wobbly on her feet and I told he it was unsafe to leave but she was set on leaving.

## 2024-10-07 NOTE — ED Triage Notes (Signed)
 Pt to er, pt states that she has a cough, fever and chills and some dry heaves.  Pt states that she has been feeling sick since yesterday.

## 2024-12-29 ENCOUNTER — Ambulatory Visit: Admitting: Diagnostic Neuroimaging
